# Patient Record
Sex: Female | Born: 1980 | Race: Black or African American | Hispanic: No | Marital: Married | Smoking: Never smoker
Health system: Southern US, Community
[De-identification: ages and names within clinical notes are randomized; demographics above are authoritative.]

## PROBLEM LIST (undated history)

## (undated) DIAGNOSIS — S335XXA Sprain of ligaments of lumbar spine, initial encounter: Secondary | ICD-10-CM

## (undated) DIAGNOSIS — Z9049 Acquired absence of other specified parts of digestive tract: Secondary | ICD-10-CM

## (undated) DIAGNOSIS — F4310 Post-traumatic stress disorder, unspecified: Secondary | ICD-10-CM

## (undated) DIAGNOSIS — L02419 Cutaneous abscess of limb, unspecified: Secondary | ICD-10-CM

## (undated) DIAGNOSIS — T7840XA Allergy, unspecified, initial encounter: Secondary | ICD-10-CM

## (undated) DIAGNOSIS — M5430 Sciatica, unspecified side: Secondary | ICD-10-CM

## (undated) HISTORY — DX: Post-traumatic stress disorder, unspecified: F43.10

## (undated) HISTORY — DX: Allergy, unspecified, initial encounter: T78.40XA

## (undated) HISTORY — PX: APPENDECTOMY: SHX54

---
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
## 2007-12-08 ENCOUNTER — Emergency Department (HOSPITAL_COMMUNITY): Admission: EM | Admit: 2007-12-08 | Discharge: 2007-12-08 | Payer: Self-pay | Admitting: Emergency Medicine

## 2008-12-30 ENCOUNTER — Ambulatory Visit: Payer: Self-pay | Admitting: Diagnostic Radiology

## 2008-12-30 ENCOUNTER — Emergency Department (HOSPITAL_BASED_OUTPATIENT_CLINIC_OR_DEPARTMENT_OTHER): Admission: EM | Admit: 2008-12-30 | Discharge: 2008-12-30 | Payer: Self-pay | Admitting: Emergency Medicine

## 2010-12-31 LAB — DIFFERENTIAL
Basophils Absolute: 0
Basophils Relative: 0
Lymphocytes Relative: 19

## 2010-12-31 LAB — URINALYSIS, ROUTINE W REFLEX MICROSCOPIC
Hgb urine dipstick: NEGATIVE
Ketones, ur: NEGATIVE
Protein, ur: NEGATIVE
Specific Gravity, Urine: 1.012

## 2010-12-31 LAB — CBC
HCT: 37.8
MCHC: 33.6
RBC: 4.3
RDW: 12.4
WBC: 5.7

## 2010-12-31 LAB — POCT CARDIAC MARKERS
CKMB, poc: <1 — ABNORMAL LOW
Myoglobin, poc: 31.9

## 2010-12-31 LAB — COMPREHENSIVE METABOLIC PANEL
ALT: 8
AST: 19
Albumin: 2.8 — ABNORMAL LOW
Alkaline Phosphatase: 42
BUN: 4 — ABNORMAL LOW
CO2: 25
Calcium: 8.6
Creatinine, Ser: 0.69
GFR calc non Af Amer: >60
Potassium: 4
Total Bilirubin: 1.2
Total Protein: 6.3

## 2012-05-03 ENCOUNTER — Encounter (HOSPITAL_COMMUNITY): Payer: Self-pay | Admitting: Emergency Medicine

## 2012-05-03 ENCOUNTER — Emergency Department (HOSPITAL_COMMUNITY)
Admission: EM | Admit: 2012-05-03 | Discharge: 2012-05-03 | Disposition: A | Discharge status: Home / Self Care | Payer: Medicaid Other | Attending: Emergency Medicine | Admitting: Emergency Medicine

## 2012-05-03 DIAGNOSIS — R5381 Other malaise: Secondary | ICD-10-CM | POA: Insufficient documentation

## 2012-05-03 DIAGNOSIS — J1189 Influenza due to unidentified influenza virus with other manifestations: Secondary | ICD-10-CM | POA: Insufficient documentation

## 2012-05-03 DIAGNOSIS — R6889 Other general symptoms and signs: Secondary | ICD-10-CM

## 2012-05-03 MED ORDER — HYDROCODONE-ACETAMINOPHEN 7.5-325 MG/15ML PO SOLN: 15.0000 mL | Freq: Four times a day (QID) | ORAL | Status: DC | PRN

## 2012-05-03 NOTE — ED Provider Notes (Signed)
History     CSN: [id number]  Arrival date & time 05/03/12  1020   First MD Initiated Contact with Patient 05/03/12 1041      Chief Complaint  Patient presents with  . Cough  . Generalized Body Aches    (Consider location/radiation/quality/duration/timing/severity/associated sxs/prior treatment) Patient is a 32 y.o. female presenting with URI. The history is provided by the patient. No language interpreter was used.  URI The primary symptoms include fever, fatigue, headaches, cough and myalgias. Primary symptoms do not include ear pain, sore throat, swollen glands, wheezing, abdominal pain, nausea, vomiting, arthralgias or rash. The current episode started yesterday. This is a new problem. The problem has been gradually worsening.  The fever began yesterday. The maximum temperature recorded prior to her arrival was 101 to 101.9 F. The temperature was taken by an oral thermometer.  The headache began yesterday. The headache developed gradually. Headache is a new problem. The pain from the headache is at a severity of 3/10. The headache is not associated with aura, photophobia, double vision, stiff neck or loss of balance.    History reviewed. No pertinent past medical history.  History reviewed. No pertinent past surgical history.  No family history on file.  History  Substance Use Topics  . Smoking status: Never Smoker   . Smokeless tobacco: Not on file  . Alcohol Use: No    OB History    Grav Para Term Preterm Abortions TAB SAB Ect Mult Living                  Review of Systems  Constitutional: Positive for fever and fatigue.       10 Systems reviewed and all are negative for acute change except as noted in the HPI.   HENT: Negative for ear pain and sore throat.   Eyes: Negative for double vision and photophobia.  Respiratory: Positive for cough. Negative for wheezing.   Gastrointestinal: Negative for nausea, vomiting and abdominal pain.  Musculoskeletal: Positive for  myalgias. Negative for arthralgias.  Skin: Negative for rash.  Neurological: Positive for headaches. Negative for loss of balance.    Allergies  Aspirin; Motrin; and Latex  Home Medications  No current outpatient prescriptions on file.  BP 140/86  Pulse 92  Temp 98.7 F (37.1 C) (Oral)  Resp 18  SpO2 100%  Physical Exam  Nursing note and vitals reviewed. Constitutional: She is oriented to person, place, and time. She appears well-developed and well-nourished. No distress.       Awake, alert, nontoxic appearance  HENT:  Head: Atraumatic.  Right Ear: External ear normal.  Left Ear: External ear normal.  Nose: Nose normal.  Mouth/Throat: Oropharynx is clear and moist. No oropharyngeal exudate.  Eyes: Conjunctivae normal are normal. Right eye exhibits no discharge. Left eye exhibits no discharge.  Neck: Neck supple.       No nuchal rigidity  Cardiovascular: Normal rate and regular rhythm.   Pulmonary/Chest: Effort normal. No respiratory distress. She has no wheezes. She has no rales. She exhibits no tenderness.  Abdominal: Soft. There is no tenderness. There is no rebound.  Musculoskeletal: She exhibits no tenderness.       ROM appears intact, no obvious focal weakness  Neurological: She is alert and oriented to person, place, and time. She has normal strength. No cranial nerve deficit. GCS eye subscore is 4. GCS verbal subscore is 5. GCS motor subscore is 6.       Mental status and motor strength appears  intact  Skin: No rash noted.  Psychiatric: She has a normal mood and affect.    ED Course  Procedures (including critical care time)  Labs Reviewed - No data to display No results found.   No diagnosis found.  11:16 AM Patient was seen and evaluated by me for the symptoms. Symptoms suggestive of influenza. She is afebrile her vital signs stable, appearing nontoxic. No evidence suggestive of meningitis, sinusitis, peritonsillar abscess, or pneumonia. Although patient  may benefit from Tamiflu, I discuss risk and benefit of the medication and patient opted not to take it. Will DC with cough medication and care instructions. Return precautions given. Patient voiced understanding and agrees with plan.  BP 140/86  Pulse 92  Temp 98.7 F (37.1 C) (Oral)  Resp 18  SpO2 100%  I have reviewed nursing notes and vital signs.  I reviewed available ER/hospitalization records thought the EMR   1. Flu-like symptoms   MDM          Fayrene Helper, PA-C 05/03/12 1118  Fayrene Helper, PA-C 05/03/12 1223

## 2012-05-03 NOTE — ED Notes (Signed)
Onset one day ago genaral body achy, non productive cough, fever and headache 7/10 throbbing.  Symptoms continued today.

## 2012-05-05 NOTE — ED Provider Notes (Signed)
Medical screening examination/treatment/procedure(s) were performed by non-physician practitioner and as supervising physician I was immediately available for consultation/collaboration.   Kaiesha Tonner, MD 05/05/12 0700 

## 2013-01-31 ENCOUNTER — Emergency Department (HOSPITAL_COMMUNITY)
Admission: EM | Admit: 2013-01-31 | Discharge: 2013-02-01 | Disposition: A | Discharge status: Home / Self Care | Payer: Medicaid Other | Attending: Emergency Medicine | Admitting: Emergency Medicine

## 2013-01-31 ENCOUNTER — Encounter (HOSPITAL_COMMUNITY): Payer: Self-pay | Admitting: Emergency Medicine

## 2013-01-31 ENCOUNTER — Emergency Department (HOSPITAL_COMMUNITY): Payer: Medicaid Other

## 2013-01-31 DIAGNOSIS — X500XXA Overexertion from strenuous movement or load, initial encounter: Secondary | ICD-10-CM | POA: Insufficient documentation

## 2013-01-31 DIAGNOSIS — IMO0002 Reserved for concepts with insufficient information to code with codable children: Secondary | ICD-10-CM | POA: Insufficient documentation

## 2013-01-31 DIAGNOSIS — Z880 Allergy status to penicillin: Secondary | ICD-10-CM | POA: Insufficient documentation

## 2013-01-31 DIAGNOSIS — Y9389 Activity, other specified: Secondary | ICD-10-CM | POA: Insufficient documentation

## 2013-01-31 DIAGNOSIS — M549 Dorsalgia, unspecified: Secondary | ICD-10-CM

## 2013-01-31 DIAGNOSIS — Y929 Unspecified place or not applicable: Secondary | ICD-10-CM | POA: Insufficient documentation

## 2013-01-31 DIAGNOSIS — Z91040 Latex allergy status: Secondary | ICD-10-CM | POA: Insufficient documentation

## 2013-01-31 LAB — CBC WITH DIFFERENTIAL/PLATELET
Basophils Relative: 0 % (ref 0–1)
Eosinophils Relative: 3 % (ref 0–5)
HCT: 32.9 % — ABNORMAL LOW (ref 36.0–46.0)
Lymphocytes Relative: 59 % — ABNORMAL HIGH (ref 12–46)
Lymphs Abs: 3.9 10*3/uL (ref 0.7–4.0)
MCH: 24.4 pg — ABNORMAL LOW (ref 26.0–34.0)
Monocytes Absolute: 0.5 10*3/uL (ref 0.1–1.0)
Monocytes Relative: 7 % (ref 3–12)
Neutrophils Relative %: 31 % — ABNORMAL LOW (ref 43–77)

## 2013-01-31 LAB — POCT I-STAT, CHEM 8
BUN: 8 mg/dL (ref 6–23)
Creatinine, Ser: 1.1 mg/dL (ref 0.50–1.10)
HCT: 35 % — ABNORMAL LOW (ref 36.0–46.0)
TCO2: 27 mmol/L (ref 0–100)

## 2013-01-31 LAB — D-DIMER, QUANTITATIVE: D-Dimer, Quant: <0.27 ug/mL-FEU (ref 0.00–0.48)

## 2013-01-31 MED ORDER — PREDNISONE 20 MG PO TABS
60.0000 mg | ORAL_TABLET | Freq: Once | ORAL | Status: AC
  Administered 2013-01-31: 60 mg via ORAL
  Filled 2013-01-31: qty 3

## 2013-01-31 MED ORDER — DIAZEPAM 5 MG PO TABS
5.0000 mg | ORAL_TABLET | Freq: Once | ORAL | Status: AC
  Administered 2013-01-31: 5 mg via ORAL
  Filled 2013-01-31: qty 1

## 2013-01-31 MED ORDER — OXYCODONE-ACETAMINOPHEN 5-325 MG PO TABS
2.0000 | ORAL_TABLET | Freq: Once | ORAL | Status: AC
  Administered 2013-01-31: 2 via ORAL
  Filled 2013-01-31: qty 2

## 2013-01-31 MED ORDER — DIAZEPAM 5 MG/ML IJ SOLN
5.0000 mg | Freq: Once | INTRAMUSCULAR | Status: AC
  Administered 2013-01-31: 5 mg via INTRAMUSCULAR
  Filled 2013-01-31: qty 2

## 2013-01-31 MED ORDER — HYDROMORPHONE HCL PF 1 MG/ML IJ SOLN
1.0000 mg | Freq: Once | INTRAMUSCULAR | Status: AC
  Administered 2013-01-31: 1 mg via INTRAMUSCULAR
  Filled 2013-01-31: qty 1

## 2013-01-31 NOTE — ED Provider Notes (Signed)
CSN: [id number]     Arrival date & time 01/31/13  1854 History  This chart was scribed for non-physician practitioner Jaynie Crumble, PA, working with Derwood Kaplan, MD by Ronal Fear, ED scribe. This patient was seen in room TR11C/TR11C and the patient's care was started at 8:54 PM.    Chief Complaint  Patient presents with  . Back Pain   Patient is a 32 y.o. female presenting with back pain. The history is provided by the patient. No language interpreter was used.  Back Pain Location:  Thoracic spine Radiates to:  L shoulder Pain severity:  Mild Pain is:  Same all the time Onset quality:  Sudden Duration:  12 hours Timing:  Constant Progression:  Worsening Chronicity:  Recurrent Context: twisting   Relieved by:  Nothing Worsened by:  Ambulation, movement, deep breathing and twisting Ineffective treatments:  OTC medications Associated symptoms: no bladder incontinence, no bowel incontinence, no leg pain and no numbness   HPI Comments: Joanne Hanna is a 32 y.o. female who presents to the Emergency Department complaining of sudden onset generalized back pain that is worse with deep breaths and movement with associated tingling in her left arm. Pt states that she turned abruptly and hurt her back.  She has had prior occurences that were shorter in duration.  Pt denies abdominal pain or stomach pain.  History reviewed. No pertinent past medical history. Past Surgical History  Procedure Laterality Date  . Appendectomy     No family history on file. History  Substance Use Topics  . Smoking status: Never Smoker   . Smokeless tobacco: Not on file  . Alcohol Use: No   OB History   Grav Para Term Preterm Abortions TAB SAB Ect Mult Living                 Review of Systems  Gastrointestinal: Negative for bowel incontinence.  Genitourinary: Negative for bladder incontinence.  Musculoskeletal: Positive for back pain and myalgias.  Neurological: Negative for numbness.  All other  systems reviewed and are negative.    Allergies  Aspirin; Food; Motrin; Latex; and Penicillins  Home Medications   Current Outpatient Rx  Name  Route  Sig  Dispense  Refill  . acetaminophen (TYLENOL) 500 MG tablet   Oral   Take 1,000 mg by mouth every 6 (six) hours as needed for moderate pain.          BP 154/109  Pulse 105  Temp(Src) 98 F (36.7 C) (Oral)  Resp 18  Ht 5\' 3"  (1.6 m)  Wt 230 lb (104.327 kg)  BMI 40.75 kg/m2  SpO2 97%  LMP 01/24/2013 Physical Exam  Nursing note and vitals reviewed. Constitutional: She appears well-developed and well-nourished. No distress.  HENT:  Head: Normocephalic.  Neck: Neck supple.  Cardiovascular: Normal rate, regular rhythm and normal heart sounds.   Pulmonary/Chest: Effort normal and breath sounds normal. No respiratory distress. She has no wheezes.  Abdominal: Soft. Bowel sounds are normal. She exhibits no distension. There is no tenderness. There is no rebound.  No CVA tenderness  Musculoskeletal: She exhibits tenderness.  Tenderness to thoracic paravertebral region. No midline tenderness. No swelling or bruising  Neurological:  5/5 and equal lower extremity strength. 2+ and equal patellar reflexes bilaterally. Pt able to dorsiflex bilateral toes and feet with good strength against resistance. Equal sensation bilaterally over thighs and lower legs.      ED Course  Procedures (including critical care time) DIAGNOSTIC STUDIES: Oxygen Saturation is 97%  on RA, adequate by my interpretation.    COORDINATION OF CARE:    9:01 PM- Pt advised of plan for treatment including medication for pain and pt agrees.  Labs Review Labs Reviewed  CBC WITH DIFFERENTIAL - Abnormal; Notable for the following:    Hemoglobin 10.5 (*)    HCT 32.9 (*)    MCV 76.3 (*)    MCH 24.4 (*)    RDW 16.3 (*)    Neutrophils Relative % 31 (*)    Lymphocytes Relative 59 (*)    All other components within normal limits  POCT I-STAT, CHEM 8 -  Abnormal; Notable for the following:    Calcium, Ion 1.32 (*)    Hemoglobin 11.9 (*)    HCT 35.0 (*)    All other components within normal limits  D-DIMER, QUANTITATIVE   Imaging Review Dg Chest 2 View  01/31/2013   CLINICAL DATA:  Chest pain.  EXAM: CHEST  2 VIEW  COMPARISON:  CHEST x-ray 12/08/2007.  FINDINGS: Lung volumes are normal. No consolidative airspace disease. No pleural effusions. No pneumothorax. No pulmonary nodule or mass noted. Pulmonary vasculature and the cardiomediastinal silhouette are within normal limits.  IMPRESSION: 1.  No radiographic evidence of acute cardiopulmonary disease.   Electronically Signed   By: Trudie Reed M.D.   On: 01/31/2013 22:30    EKG Interpretation   None       MDM   1. Back pain     Shunt with thoracic back pain, reproducible on palpation and movement. Her pain is intractable due to multiple doses of different medications. Patient received 1 mg of Dilaudid IM, 2 Percocets by mouth, 5 mg of Valium by mouth, 5 additional milligrams of Valium IM, all with no relief of her pain. Patient states that her pain actually increased from 6 to attend now. She denies any chest pain. I did get a chest x-ray due to pleuritic component of her pain and a d-dimer which both are negative. She's afebrile emergency department. Her vital signs are normal. Every time in the room patient is laughing however she refuses to move because she states she's in a lot of pain. I discussed this with Dr. Ranae Palms to see if any additional tests would be helpful. I this time I do not think any imaging is indicated and I do think her pain is musculoskeletal and could be do to either a pinched nerve or a muscle spasm. I will start her home on Percocet and Valium for spasms. She is to followup with her doctor.   Filed Vitals:   01/31/13 1859 01/31/13 2359  BP: 154/109 121/85  Pulse: 105 61  Temp: 98 F (36.7 C)   TempSrc: Oral   Resp: 18 19  Height: 5\' 3"  (1.6 m)    Weight: 230 lb (104.327 kg)   SpO2: 97% 99%   I personally performed the services described in this documentation, which was scribed in my presence. The recorded information has been reviewed and is accurate.   Lottie Mussel, PA-C 02/01/13 [phone]

## 2013-01-31 NOTE — ED Notes (Signed)
Pt had sudden onset of mid back pain after making a twisting movement.  Pt states that she took tylenol and tried to rest but pain has been increasing (this began this am).  Pt cant stand up straight due to pain.  This has occurred before but never this severe.

## 2013-01-31 NOTE — ED Notes (Signed)
PT changed positions and is now flat on the stretcher . Pt also reports pain is unchanged.

## 2013-02-01 MED ORDER — OXYCODONE-ACETAMINOPHEN 5-325 MG PO TABS: 1.0000 | ORAL_TABLET | ORAL | Status: DC | PRN

## 2013-02-01 MED ORDER — DIAZEPAM 5 MG PO TABS: 5.0000 mg | ORAL_TABLET | Freq: Three times a day (TID) | ORAL | Status: DC | PRN

## 2013-02-03 ENCOUNTER — Encounter (HOSPITAL_COMMUNITY): Payer: Self-pay | Admitting: Emergency Medicine

## 2013-02-03 ENCOUNTER — Emergency Department (HOSPITAL_COMMUNITY): Payer: Medicaid Other

## 2013-02-03 ENCOUNTER — Emergency Department (HOSPITAL_COMMUNITY)
Admission: EM | Admit: 2013-02-03 | Discharge: 2013-02-03 | Disposition: A | Discharge status: Home / Self Care | Payer: Medicaid Other | Attending: Emergency Medicine | Admitting: Emergency Medicine

## 2013-02-03 DIAGNOSIS — Z91040 Latex allergy status: Secondary | ICD-10-CM | POA: Insufficient documentation

## 2013-02-03 DIAGNOSIS — Z880 Allergy status to penicillin: Secondary | ICD-10-CM | POA: Insufficient documentation

## 2013-02-03 DIAGNOSIS — Z872 Personal history of diseases of the skin and subcutaneous tissue: Secondary | ICD-10-CM | POA: Insufficient documentation

## 2013-02-03 DIAGNOSIS — M546 Pain in thoracic spine: Secondary | ICD-10-CM

## 2013-02-03 DIAGNOSIS — Z87828 Personal history of other (healed) physical injury and trauma: Secondary | ICD-10-CM | POA: Insufficient documentation

## 2013-02-03 HISTORY — DX: Acquired absence of other specified parts of digestive tract: Z90.49

## 2013-02-03 HISTORY — DX: Sciatica, unspecified side: M54.30

## 2013-02-03 HISTORY — DX: Cutaneous abscess of limb, unspecified: L02.419

## 2013-02-03 HISTORY — DX: Sprain of ligaments of lumbar spine, initial encounter: S33.5XXA

## 2013-02-03 MED ORDER — CYCLOBENZAPRINE HCL 10 MG PO TABS: 10.0000 mg | ORAL_TABLET | Freq: Two times a day (BID) | ORAL | Status: DC | PRN

## 2013-02-03 MED ORDER — HYDROMORPHONE HCL PF 1 MG/ML IJ SOLN
1.0000 mg | Freq: Once | INTRAMUSCULAR | Status: AC
  Administered 2013-02-03: 1 mg via INTRAMUSCULAR
  Filled 2013-02-03: qty 1

## 2013-02-03 MED ORDER — PREDNISONE 20 MG PO TABS: ORAL_TABLET | ORAL | Status: DC

## 2013-02-03 MED ORDER — OXYCODONE-ACETAMINOPHEN 5-325 MG PO TABS: 2.0000 | ORAL_TABLET | ORAL | Status: DC | PRN

## 2013-02-03 NOTE — ED Provider Notes (Signed)
CSN: [id number]     Arrival date & time 02/03/13  1327 History   First MD Initiated Contact with Patient 02/03/13 1613     Chief Complaint  Patient presents with  . Pleurisy  . Back Pain   (Consider location/radiation/quality/duration/timing/severity/associated sxs/prior Treatment) HPI.... pain in the left upper back approximately T10 level for several days.   Patient was seen on 01/31/2013 in the ED for similar complaints.  She is uncertain of the mechanism of the pain, but no fever, sweats, chest, cough neurological deficits.  Palpation makes pain worse.  Past Medical History  Diagnosis Date  . History of appendectomy 1999  . Abscess of axilla   . Lumbar back sprain   . Sciatica    Past Surgical History  Procedure Laterality Date  . Appendectomy     No family history on file. History  Substance Use Topics  . Smoking status: Never Smoker   . Smokeless tobacco: Not on file  . Alcohol Use: No   OB History   Grav Para Term Preterm Abortions TAB SAB Ect Mult Living                 Review of Systems  All other systems reviewed and are negative.    Allergies  Aspirin; Food; Motrin; Latex; and Penicillins  Home Medications   Current Outpatient Rx  Name  Route  Sig  Dispense  Refill  . acetaminophen (TYLENOL) 500 MG tablet   Oral   Take 1,000 mg by mouth every 6 (six) hours as needed for moderate pain.         . diazepam (VALIUM) 5 MG tablet   Oral   Take 1 tablet (5 mg total) by mouth every 8 (eight) hours as needed for anxiety.   15 tablet   0   . oxyCODONE-acetaminophen (PERCOCET) 5-325 MG per tablet   Oral   Take 1 tablet by mouth every 4 (four) hours as needed for severe pain.   20 tablet   0   . cyclobenzaprine (FLEXERIL) 10 MG tablet   Oral   Take 1 tablet (10 mg total) by mouth 2 (two) times daily as needed for muscle spasms.   20 tablet   0   . oxyCODONE-acetaminophen (PERCOCET) 5-325 MG per tablet   Oral   Take 2 tablets by mouth every 4  (four) hours as needed.   20 tablet   0   . predniSONE (DELTASONE) 20 MG tablet      3 tabs po day one, then 2 tabs daily x 4 days   11 tablet   0    BP 136/79  Pulse 74  Temp(Src) 97.4 F (36.3 C) (Oral)  Resp 18  Ht 5\' 3"  (1.6 m)  Wt 230 lb (104.327 kg)  BMI 40.75 kg/m2  SpO2 100%  LMP 01/16/2013 Physical Exam  Nursing note and vitals reviewed. Constitutional: She is oriented to person, place, and time. She appears well-developed and well-nourished.  HENT:  Head: Normocephalic and atraumatic.  Eyes: Conjunctivae and EOM are normal. Pupils are equal, round, and reactive to light.  Neck: Normal range of motion. Neck supple.  Cardiovascular: Normal rate, regular rhythm and normal heart sounds.   Pulmonary/Chest: Effort normal and breath sounds normal.  Abdominal: Soft. Bowel sounds are normal.  Musculoskeletal: Normal range of motion.  Tender left upper back approx T10 area  Neurological: She is alert and oriented to person, place, and time.  Skin: Skin is warm and dry.  Psychiatric:  She has a normal mood and affect.    ED Course  Procedures (including critical care time) Labs Review Labs Reviewed - No data to display Imaging Review Dg Chest 2 View  02/03/2013   CLINICAL DATA:  Chest pain  EXAM: CHEST  2 VIEW  COMPARISON:  01/31/2013  FINDINGS: The heart size and mediastinal contours are within normal limits. Both lungs are clear. The visualized skeletal structures are unremarkable.  IMPRESSION: No active cardiopulmonary disease.   Electronically Signed   By: Alcide Clever M.D.   On: 02/03/2013 15:13    EKG Interpretation   None       MDM   1. Thoracic back pain    Patient has normal vital signs. She is tender to palpation in her musculature.  Discharge medications oxycodone, Flexeril 10 mg, prednisone    Donnetta Hutching, MD 02/03/13 [phone]

## 2013-02-03 NOTE — ED Notes (Addendum)
Pt reports she has been taking valium and pain pills and using a heating pad for back pain. Hurts with movement and reports she cannot work due to pain. Hurts all across ribs, worse with laughing, cough, deep breathe, and standing straight up. Feels like pulling and stabbing. Pt seems to be in distress due to pain.

## 2013-02-03 NOTE — ED Notes (Signed)
Pt took prescription percocet at 1000 today, no relief

## 2013-02-03 NOTE — Discharge Instructions (Signed)
Back Pain, Adult Low back pain is very common. About 1 in 5 people have back pain.The cause of low back pain is rarely dangerous. The pain often gets better over time.About half of people with a sudden onset of back pain feel better in just 2 weeks. About 8 in 10 people feel better by 6 weeks.  CAUSES Some common causes of back pain include:  Strain of the muscles or ligaments supporting the spine.  Wear and tear (degeneration) of the spinal discs.  Arthritis.  Direct injury to the back. DIAGNOSIS Most of the time, the direct cause of low back pain is not known.However, back pain can be treated effectively even when the exact cause of the pain is unknown.Answering your caregiver's questions about your overall health and symptoms is one of the most accurate ways to make sure the cause of your pain is not dangerous. If your caregiver needs more information, he or she may order lab work or imaging tests (X-rays or MRIs).However, even if imaging tests show changes in your back, this usually does not require surgery. HOME CARE INSTRUCTIONS For many people, back pain returns.Since low back pain is rarely dangerous, it is often a condition that people can learn to [facility] their own.   Remain active. It is stressful on the back to sit or stand in one place. Do not sit, drive, or stand in one place for more than 30 minutes at a time. Take short walks on level surfaces as soon as pain allows.Try to increase the length of time you walk each day.  Do not stay in bed.Resting more than 1 or 2 days can delay your recovery.  Do not avoid exercise or work.Your body is made to move.It is not dangerous to be active, even though your back may hurt.Your back will likely heal faster if you return to being active before your pain is gone.  Pay attention to your body when you bend and lift. Many people have less discomfortwhen lifting if they bend their knees, keep the load close to their bodies,and  avoid twisting. Often, the most comfortable positions are those that put less stress on your recovering back.  Find a comfortable position to sleep. Use a firm mattress and lie on your side with your knees slightly bent. If you lie on your back, put a pillow under your knees.  Only take over-the-counter or prescription medicines as directed by your caregiver. Over-the-counter medicines to reduce pain and inflammation are often the most helpful.Your caregiver may prescribe muscle relaxant drugs.These medicines help dull your pain so you can more quickly return to your normal activities and healthy exercise.  Put ice on the injured area.  Put ice in a plastic bag.  Place a towel between your skin and the bag.  Leave the ice on for 15-20 minutes, 03-04 times a day for the first 2 to 3 days. After that, ice and heat may be alternated to reduce pain and spasms.  Ask your caregiver about trying back exercises and gentle massage. This may be of some benefit.  Avoid feeling anxious or stressed.Stress increases muscle tension and can worsen back pain.It is important to recognize when you are anxious or stressed and learn ways to manage it.Exercise is a great option. SEEK MEDICAL CARE IF:  You have pain that is not relieved with rest or medicine.  You have pain that does not improve in 1 week.  You have new symptoms.  You are generally not feeling well. SEEK  IMMEDIATE MEDICAL CARE IF:   You have pain that radiates from your back into your legs.  You develop new bowel or bladder control problems.  You have unusual weakness or numbness in your arms or legs.  You develop nausea or vomiting.  You develop abdominal pain.  You feel faint. Document Released: 03/16/2005 Document Revised: 09/15/2011 Document Reviewed: 08/04/2010 [facility] Patient Information 2014 [place], [place].   Medication for pain, muscle spasm, prednisone.   Alternate heat and ice.

## 2013-02-04 NOTE — ED Provider Notes (Signed)
Medical screening examination/treatment/procedure(s) were performed by non-physician practitioner and as supervising physician I was immediately available for consultation/collaboration.  EKG Interpretation   None        Junius Faucett, MD 02/04/13 1628 

## 2013-02-14 ENCOUNTER — Encounter (HOSPITAL_COMMUNITY): Payer: Self-pay | Admitting: Radiology

## 2013-02-14 ENCOUNTER — Emergency Department (HOSPITAL_COMMUNITY)
Admission: EM | Admit: 2013-02-14 | Discharge: 2013-02-14 | Disposition: A | Discharge status: Home / Self Care | Payer: Medicaid Other | Attending: Emergency Medicine | Admitting: Emergency Medicine

## 2013-02-14 ENCOUNTER — Emergency Department (HOSPITAL_COMMUNITY): Payer: Medicaid Other

## 2013-02-14 DIAGNOSIS — Z3202 Encounter for pregnancy test, result negative: Secondary | ICD-10-CM | POA: Insufficient documentation

## 2013-02-14 DIAGNOSIS — Z91040 Latex allergy status: Secondary | ICD-10-CM | POA: Insufficient documentation

## 2013-02-14 DIAGNOSIS — Z8739 Personal history of other diseases of the musculoskeletal system and connective tissue: Secondary | ICD-10-CM | POA: Insufficient documentation

## 2013-02-14 DIAGNOSIS — Z880 Allergy status to penicillin: Secondary | ICD-10-CM | POA: Insufficient documentation

## 2013-02-14 DIAGNOSIS — R109 Unspecified abdominal pain: Secondary | ICD-10-CM | POA: Insufficient documentation

## 2013-02-14 DIAGNOSIS — R1110 Vomiting, unspecified: Secondary | ICD-10-CM | POA: Insufficient documentation

## 2013-02-14 DIAGNOSIS — Z9889 Other specified postprocedural states: Secondary | ICD-10-CM | POA: Insufficient documentation

## 2013-02-14 DIAGNOSIS — Z888 Allergy status to other drugs, medicaments and biological substances status: Secondary | ICD-10-CM | POA: Insufficient documentation

## 2013-02-14 LAB — CBC WITH DIFFERENTIAL/PLATELET
Basophils Relative: 0 % (ref 0–1)
Eosinophils Absolute: 0.1 10*3/uL (ref 0.0–0.7)
Eosinophils Relative: 1 % (ref 0–5)
HCT: 36 % (ref 36.0–46.0)
Hemoglobin: 11.7 g/dL — ABNORMAL LOW (ref 12.0–15.0)
Lymphocytes Relative: 32 % (ref 12–46)
MCHC: 32.5 g/dL (ref 30.0–36.0)
MCV: 74.4 fL — ABNORMAL LOW (ref 78.0–100.0)
Monocytes Absolute: 0.6 10*3/uL (ref 0.1–1.0)
Monocytes Relative: 6 % (ref 3–12)
Neutro Abs: 5.4 10*3/uL (ref 1.7–7.7)
WBC: 8.9 10*3/uL (ref 4.0–10.5)

## 2013-02-14 LAB — URINALYSIS, ROUTINE W REFLEX MICROSCOPIC
Glucose, UA: NEGATIVE mg/dL
Ketones, ur: NEGATIVE mg/dL
Leukocytes, UA: NEGATIVE
Nitrite: NEGATIVE
Protein, ur: NEGATIVE mg/dL
Specific Gravity, Urine: 1.002 — ABNORMAL LOW (ref 1.005–1.030)
Urobilinogen, UA: 0.2 mg/dL (ref 0.0–1.0)

## 2013-02-14 LAB — COMPREHENSIVE METABOLIC PANEL
ALT: 17 U/L (ref 0–35)
BUN: 9 mg/dL (ref 6–23)
CO2: 23 mEq/L (ref 19–32)
Calcium: 9.7 mg/dL (ref 8.4–10.5)
Chloride: 100 mEq/L (ref 96–112)
Creatinine, Ser: 0.88 mg/dL (ref 0.50–1.10)
GFR calc Af Amer: >90 mL/min (ref >90)
GFR calc non Af Amer: 86 mL/min — ABNORMAL LOW (ref >90)
Glucose, Bld: 96 mg/dL (ref 70–99)
Total Bilirubin: 0.5 mg/dL (ref 0.3–1.2)
Total Protein: 7.9 g/dL (ref 6.0–8.3)

## 2013-02-14 LAB — LIPASE, BLOOD: Lipase: 25 U/L (ref 11–59)

## 2013-02-14 MED ORDER — IOHEXOL 300 MG/ML  SOLN
80.0000 mL | Freq: Once | INTRAMUSCULAR | Status: AC | PRN
  Administered 2013-02-14: 80 mL via INTRAVENOUS

## 2013-02-14 MED ORDER — MORPHINE SULFATE 4 MG/ML IJ SOLN
4.0000 mg | Freq: Once | INTRAMUSCULAR | Status: AC
  Administered 2013-02-14: 4 mg via INTRAVENOUS
  Filled 2013-02-14: qty 1

## 2013-02-14 MED ORDER — MORPHINE SULFATE 4 MG/ML IJ SOLN
4.0000 mg | Freq: Once | INTRAMUSCULAR | Status: DC
  Filled 2013-02-14: qty 1

## 2013-02-14 MED ORDER — ONDANSETRON HCL 4 MG/2ML IJ SOLN
4.0000 mg | Freq: Once | INTRAMUSCULAR | Status: AC
  Administered 2013-02-14: 4 mg via INTRAVENOUS
  Filled 2013-02-14: qty 2

## 2013-02-14 MED ORDER — HYDROCODONE-ACETAMINOPHEN 5-325 MG PO TABS: 2.0000 | ORAL_TABLET | ORAL | Status: DC | PRN

## 2013-02-14 NOTE — ED Provider Notes (Addendum)
CSN: [id number]     Arrival date & time 02/14/13  0145 History   First MD Initiated Contact with Patient 02/14/13 0227     Chief Complaint  Patient presents with  . Abdominal Pain   (Consider location/radiation/quality/duration/timing/severity/associated sxs/prior Treatment) Patient is a 32 y.o. female presenting with abdominal pain. The history is provided by the patient.  Abdominal Pain Pain location:  Epigastric Pain quality: cramping   Pain radiates to:  Does not radiate Pain severity:  Severe Onset quality:  Sudden Duration:  24 hours Timing:  Constant Progression:  Worsening Chronicity:  New Context: not eating   Relieved by:  Nothing Worsened by:  Nothing tried Ineffective treatments:  None tried   Past Medical History  Diagnosis Date  . History of appendectomy 1999  . Abscess of axilla   . Lumbar back sprain   . Sciatica    Past Surgical History  Procedure Laterality Date  . Appendectomy     No family history on file. History  Substance Use Topics  . Smoking status: Never Smoker   . Smokeless tobacco: Not on file  . Alcohol Use: No   OB History   Grav Para Term Preterm Abortions TAB SAB Ect Mult Living                 Review of Systems  Gastrointestinal: Positive for abdominal pain.    Allergies  Aspirin; Food; Motrin; Latex; and Penicillins  Home Medications   Current Outpatient Rx  Name  Route  Sig  Dispense  Refill  . acetaminophen (TYLENOL) 500 MG tablet   Oral   Take 1,000 mg by mouth every 6 (six) hours as needed for moderate pain.         . cyclobenzaprine (FLEXERIL) 10 MG tablet   Oral   Take 1 tablet (10 mg total) by mouth 2 (two) times daily as needed for muscle spasms.   20 tablet   0   . diazepam (VALIUM) 5 MG tablet   Oral   Take 1 tablet (5 mg total) by mouth every 8 (eight) hours as needed for anxiety.   15 tablet   0   . oxyCODONE-acetaminophen (PERCOCET) 5-325 MG per tablet   Oral   Take 1 tablet by mouth every 4  (four) hours as needed for severe pain.   20 tablet   0   . oxyCODONE-acetaminophen (PERCOCET) 5-325 MG per tablet   Oral   Take 2 tablets by mouth every 4 (four) hours as needed.   20 tablet   0   . predniSONE (DELTASONE) 20 MG tablet      3 tabs po day one, then 2 tabs daily x 4 days   11 tablet   0    BP 138/86  Pulse 93  Temp(Src) 97.8 F (36.6 C) (Oral)  Resp 20  Ht 5\' 3"  (1.6 m)  Wt 230 lb (104.327 kg)  BMI 40.75 kg/m2  SpO2 100%  LMP 01/09/2013 Physical Exam  Nursing note and vitals reviewed. Constitutional: She is oriented to person, place, and time. She appears well-developed and well-nourished. No distress.  HENT:  Head: Normocephalic and atraumatic.  Neck: Normal range of motion. Neck supple.  Cardiovascular: Normal rate and regular rhythm.  Exam reveals no gallop and no friction rub.   No murmur heard. Pulmonary/Chest: Effort normal and breath sounds normal. No respiratory distress. She has no wheezes.  Abdominal: Soft. Bowel sounds are normal. She exhibits no distension. There is tenderness.  There  is tenderness to palpation in the right upper quadrant without rebound or guarding. There are no masses.  Musculoskeletal: Normal range of motion.  Neurological: She is alert and oriented to person, place, and time.  Skin: Skin is warm and dry. She is not diaphoretic.    ED Course  Procedures (including critical care time) Labs Review Labs Reviewed  CBC WITH DIFFERENTIAL  COMPREHENSIVE METABOLIC PANEL  LIPASE, BLOOD  URINALYSIS, ROUTINE W REFLEX MICROSCOPIC  PREGNANCY, URINE   Imaging Review No results found.    MDM  No diagnosis found. Patient presents with complaints of severe mid abdominal pain that started this evening. She retched and vomited several times. Workup reveals unremarkable laboratory studies, specifically there is no leukocytosis and no evidence for hepatitis or pancreatitis. Ultrasound was performed and revealed a normal  gallbladder. She was not feeling better after the ultrasound a CT scan of the abdomen and pelvis was performed and was unremarkable. By the time the study was completed the patient was feeling much better. She was reexamined and her abdomen remained benign. At this point I do not feel as though further workup is indicated. She will be discharged with pain medication and when necessary followup if her symptoms worsen or change.    Geoffery Lyons, MD 02/14/13 4098  Geoffery Lyons, MD 02/20/13 1191  Geoffery Lyons, MD 02/21/13 [phone]

## 2013-02-14 NOTE — ED Notes (Signed)
Patient returned from CT

## 2013-02-14 NOTE — ED Notes (Signed)
Pt reports that pain started appros 1800 on 02/13/13 in upper abdomen. Pain got worse until went to bed when it was a 5/10 pain woke patient approx 0100 and was a 9-10/10 upper abdomen shooting down to lower abdomen. LMP 13 Nov 14. Denies andy discharge.

## 2013-07-19 ENCOUNTER — Encounter (HOSPITAL_COMMUNITY): Payer: Self-pay | Admitting: Emergency Medicine

## 2013-07-19 ENCOUNTER — Emergency Department (HOSPITAL_COMMUNITY)
Admission: EM | Admit: 2013-07-19 | Discharge: 2013-07-19 | Disposition: A | Discharge status: Home / Self Care | Payer: Medicaid Other | Attending: Emergency Medicine | Admitting: Emergency Medicine

## 2013-07-19 ENCOUNTER — Emergency Department (HOSPITAL_COMMUNITY)
Admission: EM | Admit: 2013-07-19 | Discharge: 2013-07-20 | Disposition: A | Discharge status: Home / Self Care | Payer: Medicaid Other | Attending: Emergency Medicine | Admitting: Emergency Medicine

## 2013-07-19 DIAGNOSIS — R112 Nausea with vomiting, unspecified: Secondary | ICD-10-CM | POA: Insufficient documentation

## 2013-07-19 DIAGNOSIS — Z880 Allergy status to penicillin: Secondary | ICD-10-CM | POA: Insufficient documentation

## 2013-07-19 DIAGNOSIS — Z87828 Personal history of other (healed) physical injury and trauma: Secondary | ICD-10-CM | POA: Insufficient documentation

## 2013-07-19 DIAGNOSIS — Z8739 Personal history of other diseases of the musculoskeletal system and connective tissue: Secondary | ICD-10-CM | POA: Insufficient documentation

## 2013-07-19 DIAGNOSIS — Z872 Personal history of diseases of the skin and subcutaneous tissue: Secondary | ICD-10-CM | POA: Insufficient documentation

## 2013-07-19 DIAGNOSIS — R197 Diarrhea, unspecified: Secondary | ICD-10-CM | POA: Insufficient documentation

## 2013-07-19 DIAGNOSIS — Z79899 Other long term (current) drug therapy: Secondary | ICD-10-CM | POA: Insufficient documentation

## 2013-07-19 DIAGNOSIS — Z91040 Latex allergy status: Secondary | ICD-10-CM | POA: Insufficient documentation

## 2013-07-19 DIAGNOSIS — Z3202 Encounter for pregnancy test, result negative: Secondary | ICD-10-CM | POA: Insufficient documentation

## 2013-07-19 DIAGNOSIS — Z9089 Acquired absence of other organs: Secondary | ICD-10-CM | POA: Insufficient documentation

## 2013-07-19 DIAGNOSIS — R079 Chest pain, unspecified: Secondary | ICD-10-CM | POA: Insufficient documentation

## 2013-07-19 LAB — CBC WITH DIFFERENTIAL/PLATELET
Basophils Absolute: 0 10*3/uL (ref 0.0–0.1)
Basophils Relative: 0 % (ref 0–1)
EOS PCT: 2 % (ref 0–5)
Eosinophils Absolute: 0.1 10*3/uL (ref 0.0–0.7)
HEMATOCRIT: 40.1 % (ref 36.0–46.0)
Hemoglobin: 12.7 g/dL (ref 12.0–15.0)
LYMPHS ABS: 1.7 10*3/uL (ref 0.7–4.0)
LYMPHS PCT: 43 % (ref 12–46)
MCH: 24.4 pg — ABNORMAL LOW (ref 26.0–34.0)
MCHC: 31.7 g/dL (ref 30.0–36.0)
MCV: 77 fL — AB (ref 78.0–100.0)
Monocytes Absolute: 0.2 10*3/uL (ref 0.1–1.0)
Monocytes Relative: 6 % (ref 3–12)
Neutro Abs: 2 10*3/uL (ref 1.7–7.7)
Neutrophils Relative %: 49 % (ref 43–77)
Platelets: ADEQUATE 10*3/uL (ref 150–400)
RBC: 5.21 MIL/uL — AB (ref 3.87–5.11)
RDW: 16.5 % — ABNORMAL HIGH (ref 11.5–15.5)
WBC: 4 10*3/uL (ref 4.0–10.5)

## 2013-07-19 LAB — BASIC METABOLIC PANEL
BUN: 9 mg/dL (ref 6–23)
CO2: 18 meq/L — AB (ref 19–32)
CREATININE: 0.77 mg/dL (ref 0.50–1.10)
Calcium: 9.4 mg/dL (ref 8.4–10.5)
Chloride: 107 mEq/L (ref 96–112)
GFR calc Af Amer: >90 mL/min (ref >90)
GFR calc non Af Amer: >90 mL/min (ref >90)
Glucose, Bld: 95 mg/dL (ref 70–99)
POTASSIUM: 4.1 meq/L (ref 3.7–5.3)
Sodium: 139 mEq/L (ref 137–147)

## 2013-07-19 LAB — URINALYSIS, ROUTINE W REFLEX MICROSCOPIC
Bilirubin Urine: NEGATIVE
GLUCOSE, UA: NEGATIVE mg/dL
Hgb urine dipstick: NEGATIVE
KETONES UR: NEGATIVE mg/dL
LEUKOCYTES UA: NEGATIVE
NITRITE: NEGATIVE
PH: 6.5 (ref 5.0–8.0)
Protein, ur: NEGATIVE mg/dL
Specific Gravity, Urine: 1.006 (ref 1.005–1.030)
Urobilinogen, UA: 0.2 mg/dL (ref 0.0–1.0)

## 2013-07-19 LAB — POC URINE PREG, ED: PREG TEST UR: NEGATIVE

## 2013-07-19 MED ORDER — SODIUM CHLORIDE 0.9 % IV SOLN
1000.0000 mL | Freq: Once | INTRAVENOUS | Status: AC
  Administered 2013-07-19: 1000 mL via INTRAVENOUS

## 2013-07-19 MED ORDER — ONDANSETRON HCL 4 MG/2ML IJ SOLN
4.0000 mg | Freq: Once | INTRAMUSCULAR | Status: AC
  Administered 2013-07-19: 4 mg via INTRAVENOUS
  Filled 2013-07-19: qty 2

## 2013-07-19 MED ORDER — SODIUM CHLORIDE 0.9 % IV SOLN: 1000.0000 mL | INTRAVENOUS | Status: DC

## 2013-07-19 MED ORDER — ONDANSETRON HCL 4 MG PO TABS: 4.0000 mg | ORAL_TABLET | Freq: Four times a day (QID) | ORAL | Status: DC | PRN

## 2013-07-19 NOTE — ED Provider Notes (Signed)
CSN: [id number]     Arrival date & time 07/19/13  [id number] History   First MD Initiated Contact with Patient 07/19/13 0541     Chief Complaint  Patient presents with  . Nausea  . Emesis  . Diarrhea     (Consider location/radiation/quality/duration/timing/severity/associated sxs/prior Treatment) Patient is a 33 y.o. female presenting with vomiting and diarrhea. The history is provided by the patient.  Emesis Associated symptoms: diarrhea   Diarrhea Associated symptoms: vomiting   She had onset yesterday morning of nausea and vomiting. Symptoms are severe and have been persistent. She has had some mild diarrhea. She denies fever, chills, sweats. There's mild abdominal soreness but no true abdominal pain. She has not taken anything to try to treat her symptoms. She denies any sick contacts and denies eating anything bad.  Past Medical History  Diagnosis Date  . History of appendectomy 1999  . Abscess of axilla   . Lumbar back sprain   . Sciatica    Past Surgical History  Procedure Laterality Date  . Appendectomy     No family history on file. History  Substance Use Topics  . Smoking status: Never Smoker   . Smokeless tobacco: Not on file  . Alcohol Use: Yes     Comment: occasionally   OB History   Grav Para Term Preterm Abortions TAB SAB Ect Mult Living                 Review of Systems  Gastrointestinal: Positive for vomiting and diarrhea.  All other systems reviewed and are negative.     Allergies  Aspirin; Food; Motrin; Latex; and Penicillins  Home Medications   Prior to Admission medications   Medication Sig Start Date End Date Taking? Authorizing Provider  acetaminophen (TYLENOL) 500 MG tablet Take 1,000 mg by mouth every 6 (six) hours as needed for moderate pain.    Historical Provider, MD  cyclobenzaprine (FLEXERIL) 10 MG tablet Take 1 tablet (10 mg total) by mouth 2 (two) times daily as needed for muscle spasms. 02/03/13   Donnetta HutchingBrian Cook, MD  diazepam (VALIUM) 5  MG tablet Take 1 tablet (5 mg total) by mouth every 8 (eight) hours as needed for anxiety. 02/01/13   Tatyana A Kirichenko, PA-C  HYDROcodone-acetaminophen (NORCO) 5-325 MG per tablet Take 2 tablets by mouth every 4 (four) hours as needed. 02/14/13   Geoffery Lyonsouglas Delo, MD  oxyCODONE-acetaminophen (PERCOCET) 5-325 MG per tablet Take 2 tablets by mouth every 4 (four) hours as needed. 02/03/13   Donnetta HutchingBrian Cook, MD   BP 101/70  Pulse 61  Temp(Src) 97.5 F (36.4 C) (Oral)  Resp 18  Ht 5\' 3"  (1.6 m)  Wt 225 lb (102.059 kg)  BMI 39.87 kg/m2  SpO2 100% Physical Exam  Nursing note and vitals reviewed.  33 year old female, who appears uncomfortable and is intermittently retching, but his in no acute distress. Vital signs are normal. Oxygen saturation is 100%, which is normal. Head is normocephalic and atraumatic. PERRLA, EOMI. Oropharynx is clear. Neck is nontender and supple without adenopathy or JVD. Back is nontender and there is no CVA tenderness. Lungs are clear without rales, wheezes, or rhonchi. Chest is nontender. Heart has regular rate and rhythm without murmur. Abdomen is soft, flat, nontender without masses or hepatosplenomegaly and peristalsis is hypoactive. Extremities have no cyanosis or edema, full range of motion is present. Skin is warm and dry without rash. Neurologic: Mental status is normal, cranial nerves are intact, there are no motor or  sensory deficits.  ED Course  Procedures (including critical care time) Labs Review Results for orders placed during the hospital encounter of 07/19/13  CBC WITH DIFFERENTIAL      Result Value Ref Range   WBC 4.0  4.0 - 10.5 K/uL   RBC 5.21 (*) 3.87 - 5.11 MIL/uL   Hemoglobin 12.7  12.0 - 15.0 g/dL   HCT 16.140.1  09.636.0 - 04.546.0 %   MCV 77.0 (*) 78.0 - 100.0 fL   MCH 24.4 (*) 26.0 - 34.0 pg   MCHC 31.7  30.0 - 36.0 g/dL   RDW 40.916.5 (*) 81.111.5 - 91.415.5 %   Platelets    150 - 400 K/uL   Value: PLATELET CLUMPS NOTED ON SMEAR, COUNT APPEARS ADEQUATE    Neutrophils Relative % 49  43 - 77 %   Lymphocytes Relative 43  12 - 46 %   Monocytes Relative 6  3 - 12 %   Eosinophils Relative 2  0 - 5 %   Basophils Relative 0  0 - 1 %   Neutro Abs 2.0  1.7 - 7.7 K/uL   Lymphs Abs 1.7  0.7 - 4.0 K/uL   Monocytes Absolute 0.2  0.1 - 1.0 K/uL   Eosinophils Absolute 0.1  0.0 - 0.7 K/uL   Basophils Absolute 0.0  0.0 - 0.1 K/uL   RBC Morphology ELLIPTOCYTES    BASIC METABOLIC PANEL      Result Value Ref Range   Sodium 139  137 - 147 mEq/L   Potassium 4.1  3.7 - 5.3 mEq/L   Chloride 107  96 - 112 mEq/L   CO2 18 (*) 19 - 32 mEq/L   Glucose, Bld 95  70 - 99 mg/dL   BUN 9  6 - 23 mg/dL   Creatinine, Ser 7.820.77  0.50 - 1.10 mg/dL   Calcium 9.4  8.4 - 95.610.5 mg/dL   GFR calc non Af Amer >90  >90 mL/min   GFR calc Af Amer >90  >90 mL/min  URINALYSIS, ROUTINE W REFLEX MICROSCOPIC      Result Value Ref Range   Color, Urine YELLOW  YELLOW   APPearance CLEAR  CLEAR   Specific Gravity, Urine 1.006  1.005 - 1.030   pH 6.5  5.0 - 8.0   Glucose, UA NEGATIVE  NEGATIVE mg/dL   Hgb urine dipstick NEGATIVE  NEGATIVE   Bilirubin Urine NEGATIVE  NEGATIVE   Ketones, ur NEGATIVE  NEGATIVE mg/dL   Protein, ur NEGATIVE  NEGATIVE mg/dL   Urobilinogen, UA 0.2  0.0 - 1.0 mg/dL   Nitrite NEGATIVE  NEGATIVE   Leukocytes, UA NEGATIVE  NEGATIVE  POC URINE PREG, ED      Result Value Ref Range   Preg Test, Ur NEGATIVE  NEGATIVE   MDM   Final diagnoses:  Nausea vomiting and diarrhea    Nausea, vomiting, diarrhea consistent with a viral gastroenteritis. She will be given IV hydration and IV ondansetron. Will also need to check pregnancy test.  She feels much better after above noted treatment. She will be discharged with prescription for ondansetron. Laboratory workup is significant for mild decrease in CO2 with normal anion gap which is probably indicative of dehydration and diarrhea. This should normalize with resumption of normal diet.  Dione Boozeavid Verlan Grotz, MD 07/19/13 [phone]

## 2013-07-19 NOTE — Discharge Instructions (Signed)
Nausea and Vomiting °Nausea is a sick feeling that often comes before throwing up (vomiting). Vomiting is a reflex where stomach contents come out of your mouth. Vomiting can cause severe loss of body fluids (dehydration). Children and elderly adults can become dehydrated quickly, especially if they also have diarrhea. Nausea and vomiting are symptoms of a condition or disease. It is important to find the cause of your symptoms. °CAUSES  °· Direct irritation of the stomach lining. This irritation can result from increased acid production (gastroesophageal reflux disease), infection, food poisoning, taking certain medicines (such as nonsteroidal anti-inflammatory drugs), alcohol use, or tobacco use. °· Signals from the brain. These signals could be caused by a headache, heat exposure, an inner ear disturbance, increased pressure in the brain from injury, infection, a tumor, or a concussion, pain, emotional stimulus, or metabolic problems. °· An obstruction in the gastrointestinal tract (bowel obstruction). °· Illnesses such as diabetes, hepatitis, gallbladder problems, appendicitis, kidney problems, cancer, sepsis, atypical symptoms of a heart attack, or eating disorders. °· Medical treatments such as chemotherapy and radiation. °· Receiving medicine that makes you sleep (general anesthetic) during surgery. °DIAGNOSIS °Your caregiver may ask for tests to be done if the problems do not improve after a few days. Tests may also be done if symptoms are severe or if the reason for the nausea and vomiting is not clear. Tests may include: °· Urine tests. °· Blood tests. °· Stool tests. °· Cultures (to look for evidence of infection). °· X-rays or other imaging studies. °Test results can help your caregiver make decisions about treatment or the need for additional tests. °TREATMENT °You need to stay well hydrated. Drink frequently but in small amounts. You may wish to drink water, sports drinks, clear broth, or eat frozen  ice pops or gelatin dessert to help stay hydrated. When you eat, eating slowly may help prevent nausea. There are also some antinausea medicines that may help prevent nausea. °HOME CARE INSTRUCTIONS  °· Take all medicine as directed by your caregiver. °· If you do not have an appetite, do not force yourself to eat. However, you must continue to drink fluids. °· If you have an appetite, eat a normal diet unless your caregiver tells you differently. °· Eat a variety of complex carbohydrates (rice, wheat, potatoes, bread), lean meats, yogurt, fruits, and vegetables. °· Avoid high-fat foods because they are more difficult to digest. °· Drink enough water and fluids to keep your urine clear or pale yellow. °· If you are dehydrated, ask your caregiver for specific rehydration instructions. Signs of dehydration may include: °· Severe thirst. °· Dry lips and mouth. °· Dizziness. °· Dark urine. °· Decreasing urine frequency and amount. °· Confusion. °· Rapid breathing or pulse. °SEEK IMMEDIATE MEDICAL CARE IF:  °· You have blood or brown flecks (like coffee grounds) in your vomit. °· You have black or bloody stools. °· You have a severe headache or stiff neck. °· You are confused. °· You have severe abdominal pain. °· You have chest pain or trouble breathing. °· You do not urinate at least once every 8 hours. °· You develop cold or clammy skin. °· You continue to vomit for longer than 24 to 48 hours. °· You have a fever. °MAKE SURE YOU:  °· Understand these instructions. °· Will watch your condition. °· Will get help right away if you are not doing well or get worse. °Document Released: 03/16/2005 Document Revised: 06/08/2011 Document Reviewed: 08/13/2010 °[employer]® Patient Information ©2014 [employer]. ° °Diarrhea °Diarrhea is frequent   loose and watery bowel movements. It can cause you to feel weak and dehydrated. Dehydration can cause you to become tired and thirsty, have a dry mouth, and have decreased urination that  often is dark yellow. Diarrhea is a sign of another problem, most often an infection that will not last long. In most cases, diarrhea typically lasts 2 3 days. However, it can last longer if it is a sign of something more serious. It is important to treat your diarrhea as directed by your caregive to lessen or prevent future episodes of diarrhea. °CAUSES  °Some common causes include: °· Gastrointestinal infections caused by viruses, bacteria, or parasites. °· Food poisoning or food allergies. °· Certain medicines, such as antibiotics, chemotherapy, and laxatives. °· Artificial sweeteners and fructose. °· Digestive disorders. °HOME CARE INSTRUCTIONS °· Ensure adequate fluid intake (hydration): have 1 cup (8 oz) of fluid for each diarrhea episode. Avoid fluids that contain simple sugars or sports drinks, fruit juices, whole milk products, and sodas. Your urine should be clear or pale yellow if you are drinking enough fluids. Hydrate with an oral rehydration solution that you can purchase at pharmacies, retail stores, and online. You can prepare an oral rehydration solution at home by mixing the following ingredients together: °·   tsp table salt. °· ¾ tsp baking soda. °·  tsp salt substitute containing potassium chloride. °· 1  tablespoons sugar. °· 1 L (34 oz) of water. °· Certain foods and beverages may increase the speed at which food moves through the gastrointestinal (GI) tract. These foods and beverages should be avoided and include: °· Caffeinated and alcoholic beverages. °· High-fiber foods, such as raw fruits and vegetables, nuts, seeds, and whole grain breads and cereals. °· Foods and beverages sweetened with sugar alcohols, such as xylitol, sorbitol, and mannitol. °· Some foods may be well tolerated and may help thicken stool including: °· Starchy foods, such as rice, toast, pasta, low-sugar cereal, oatmeal, grits, baked potatoes, crackers, and bagels. °· Bananas. °· Applesauce. °· Add probiotic-rich foods  to help increase healthy bacteria in the GI tract, such as yogurt and fermented milk products. °· Wash your hands well after each diarrhea episode. °· Only take over-the-counter or prescription medicines as directed by your caregiver. °· Take a warm bath to relieve any burning or pain from frequent diarrhea episodes. °SEEK IMMEDIATE MEDICAL CARE IF:  °· You are unable to keep fluids down. °· You have persistent vomiting. °· You have blood in your stool, or your stools are black and tarry. °· You do not urinate in 6 8 hours, or there is only a small amount of very dark urine. °· You have abdominal pain that increases or localizes. °· You have weakness, dizziness, confusion, or lightheadedness. °· You have a severe headache. °· Your diarrhea gets worse or does not get better. °· You have a fever or persistent symptoms for more than 2 3 days. °· You have a fever and your symptoms suddenly get worse. °MAKE SURE YOU:  °· Understand these instructions. °· Will watch your condition. °· Will get help right away if you are not doing well or get worse. °Document Released: 03/06/2002 Document Revised: 03/02/2012 Document Reviewed: 11/22/2011 °ExitCare® Patient Information ©2014 [employer]. ° °Ondansetron tablets °What is this medicine? °ONDANSETRON (on DAN se tron) is used to treat nausea and vomiting caused by chemotherapy. It is also used to prevent or treat nausea and vomiting after surgery. °This medicine may be used for other purposes; ask your health   care provider or pharmacist if you have questions. °COMMON BRAND NAME(S): Zofran °What should I tell my health care provider before I take this medicine? °They need to know if you have any of these conditions: °-heart disease °-history of irregular heartbeat °-liver disease °-low levels of magnesium or potassium in the blood °-an unusual or allergic reaction to ondansetron, granisetron, other medicines, foods, dyes, or preservatives °-pregnant or trying to get  pregnant °-breast-feeding °How should I use this medicine? °Take this medicine by mouth with a glass of water. Follow the directions on your prescription label. Take your doses at regular intervals. Do not take your medicine more often than directed. °Talk to your pediatrician regarding the use of this medicine in children. Special care may be needed. °Overdosage: If you think you have taken too much of this medicine contact a poison control center or emergency room at once. °NOTE: This medicine is only for you. Do not share this medicine with others. °What if I miss a dose? °If you miss a dose, take it as soon as you can. If it is almost time for your next dose, take only that dose. Do not take double or extra doses. °What may interact with this medicine? °Do not take this medicine with any of the following medications: °-apomorphine °-certain medicines for fungal infections like fluconazole, itraconazole, ketoconazole, posaconazole, voriconazole °-cisapride °-dofetilide °-dronedarone °-pimozide °-thioridazine °-ziprasidone  °This medicine may also interact with the following medications: °-carbamazepine °-certain medicines for depression, anxiety, or psychotic disturbances °-fentanyl °-linezolid °-MAOIs like Carbex, Eldepryl, Marplan, Nardil, and Parnate °-methylene blue (injected into a vein) °-other medicines that prolong the QT interval (cause an abnormal heart rhythm) °-phenytoin °-rifampicin °-tramadol °This list may not describe all possible interactions. Give your health care provider a list of all the medicines, herbs, non-prescription drugs, or dietary supplements you use. Also tell them if you smoke, drink alcohol, or use illegal drugs. Some items may interact with your medicine. °What should I watch for while using this medicine? °Check with your doctor or health care professional right away if you have any sign of an allergic reaction. °What side effects may I notice from receiving this medicine? °Side  effects that you should report to your doctor or health care professional as soon as possible: °-allergic reactions like skin rash, itching or hives, swelling of the face, lips or tongue °-breathing problems °-confusion °-dizziness °-fast or irregular heartbeat °-feeling faint or lightheaded, falls °-fever and chills °-loss of balance or coordination °-seizures °-sweating °-swelling of the hands or feet °-tightness in the chest °-tremors °-unusually weak or tired °Side effects that usually do not require medical attention (report to your doctor or health care professional if they continue or are bothersome): °-constipation or diarrhea °-headache °This list may not describe all possible side effects. Call your doctor for medical advice about side effects. You may report side effects to FDA at 1-800-FDA-1088. °Where should I keep my medicine? °Keep out of the reach of children. °Store between 2 and 30 degrees C (36 and 86 degrees F). Throw away any unused medicine after the expiration date. °NOTE: This sheet is a summary. It may not cover all possible information. If you have questions about this medicine, talk to your doctor, pharmacist, or health care provider. °© 2014, [employer]. (2012-12-21 16:27:45) ° °

## 2013-07-19 NOTE — ED Notes (Signed)
Pt. States that last night around 2000 she wasn't feeling well. She woke up around 0430 with N/VD. States she has been vomiting since time.Diarrhea 4-5 times. Denies fever, chills or sweats.

## 2013-07-19 NOTE — ED Notes (Signed)
Pt reports n/v that started yesterday. Pt recently seen in ED yesterday and still not feeling any better. Pt states that she continues to dry heave and is not able to keep anything down. Vomiting continues reports pt. Pt states that she has chest pain mid chest that gets worse with vomiting. Pt alert in triage.

## 2013-07-20 LAB — I-STAT CHEM 8, ED
BUN: 6 mg/dL (ref 6–23)
CHLORIDE: 105 meq/L (ref 96–112)
Calcium, Ion: 1.22 mmol/L (ref 1.12–1.23)
Creatinine, Ser: 1 mg/dL (ref 0.50–1.10)
GLUCOSE: 87 mg/dL (ref 70–99)
HEMATOCRIT: 37 % (ref 36.0–46.0)
HEMOGLOBIN: 12.6 g/dL (ref 12.0–15.0)
POTASSIUM: 3.4 meq/L — AB (ref 3.7–5.3)
SODIUM: 142 meq/L (ref 137–147)
TCO2: 22 mmol/L (ref 0–100)

## 2013-07-20 MED ORDER — METOCLOPRAMIDE HCL 5 MG/ML IJ SOLN
10.0000 mg | Freq: Once | INTRAMUSCULAR | Status: AC
  Administered 2013-07-20: 10 mg via INTRAVENOUS
  Filled 2013-07-20: qty 2

## 2013-07-20 MED ORDER — POTASSIUM CHLORIDE 20 MEQ/15ML (10%) PO LIQD
40.0000 meq | Freq: Once | ORAL | Status: AC
  Administered 2013-07-20: 40 meq via ORAL
  Filled 2013-07-20: qty 30

## 2013-07-20 MED ORDER — SODIUM CHLORIDE 0.9 % IV SOLN
80.0000 mg | Freq: Once | INTRAVENOUS | Status: AC
  Administered 2013-07-20: 80 mg via INTRAVENOUS
  Filled 2013-07-20: qty 80

## 2013-07-20 MED ORDER — PROMETHAZINE HCL 25 MG PO TABS: 25.0000 mg | ORAL_TABLET | Freq: Four times a day (QID) | ORAL | Status: DC | PRN

## 2013-07-20 MED ORDER — DIPHENHYDRAMINE HCL 50 MG/ML IJ SOLN
25.0000 mg | Freq: Once | INTRAMUSCULAR | Status: AC
  Administered 2013-07-20: 25 mg via INTRAVENOUS
  Filled 2013-07-20: qty 1

## 2013-07-20 NOTE — ED Notes (Signed)
Pt reports arm is itching.

## 2013-07-20 NOTE — ED Provider Notes (Signed)
Medical screening examination/treatment/procedure(s) were performed by non-physician practitioner and as supervising physician I was immediately available for consultation/collaboration.   EKG Interpretation None       Saralee Bolick M OtterOlivia Mackie, MD 07/20/13 [phone]

## 2013-07-20 NOTE — ED Provider Notes (Signed)
CSN: [id number]     Arrival date & time 07/19/13  2213 History   First MD Initiated Contact with Patient 07/19/13 2312     Chief Complaint  Patient presents with  . Emesis  . Chest Pain     (Consider location/radiation/quality/duration/timing/severity/associated sxs/prior Treatment) The history is provided by the patient and medical records. No language interpreter was used.    Joanne Hanna is a 33 y.o. female  with a hx of appendectomy, sciatica presents to the Emergency Department complaining of gradual, persistent, progressively worsening nausea onset yesterday evening and vomiting beginning at 4:30am.  Pt [facility].  Pt reports she was able to keep fluids down there but when she went home she developed nausea again.  She reports dry heaving at home and vomiting anything that she attempts to eat or drink.  Pt also reports anterior center chest pain that occurs only with dry heaving.  She reports no pain at rest.  Pt reports NBNB emesis.  Pt reports diarrhea earlier, but it was without melena or hematochezia and has largely resolved. Pt reports trying to eat soup and crackers without ability to hold it down. She reports taking the Zofran with water (swallowed the ODT pill as opposed to letting it dissolve). NO aggravating or alleviating factors. Pt denies fever, chills, headache, neck pain, SOB, weakness, dizziness, syncope, dysuria.  Pr reports her stomach feels on fire inside.       Past Medical History  Diagnosis Date  . History of appendectomy 1999  . Abscess of axilla   . Lumbar back sprain   . Sciatica    Past Surgical History  Procedure Laterality Date  . Appendectomy     History reviewed. No pertinent family history. History  Substance Use Topics  . Smoking status: Never Smoker   . Smokeless tobacco: Not on file  . Alcohol Use: Yes     Comment: occasionally   OB History   Grav Para Term Preterm Abortions TAB SAB Ect Mult Living                  Review of Systems  Constitutional: Negative for fever, diaphoresis, appetite change, fatigue and unexpected weight change.  HENT: Negative for mouth sores.   Eyes: Negative for visual disturbance.  Respiratory: Negative for cough, chest tightness, shortness of breath and wheezing.   Cardiovascular: Positive for chest pain (only with vomiting).  Gastrointestinal: Positive for nausea and vomiting. Negative for abdominal pain, diarrhea and constipation.  Endocrine: Negative for polydipsia, polyphagia and polyuria.  Genitourinary: Negative for dysuria, urgency, frequency and hematuria.  Musculoskeletal: Negative for back pain and neck stiffness.  Skin: Negative for rash.  Allergic/Immunologic: Negative for immunocompromised state.  Neurological: Negative for syncope, light-headedness and headaches.  Hematological: Does not bruise/bleed easily.  Psychiatric/Behavioral: Negative for sleep disturbance. The patient is not nervous/anxious.       Allergies  Aspirin; Food; Motrin; Latex; and Penicillins  Home Medications   Prior to Admission medications   Medication Sig Start Date End Date Taking? Authorizing Provider  Multiple Vitamin (MULTIVITAMIN WITH MINERALS) TABS tablet Take 1 tablet by mouth daily.   Yes Historical Provider, MD  ondansetron (ZOFRAN) 4 MG tablet Take 1 tablet (4 mg total) by mouth every 6 (six) hours as needed for nausea or vomiting. 07/19/13  Yes Dione Boozeavid Glick, MD   BP 113/76  Pulse 55  Temp(Src) 97.8 F (36.6 C) (Oral)  Resp 18  SpO2 100%  LMP 07/11/2013 Physical Exam  Nursing note and vitals reviewed. Constitutional: She is oriented to person, place, and time. She appears well-developed and well-nourished. No distress.  Awake, alert, nontoxic appearance  HENT:  Head: Normocephalic and atraumatic.  Mouth/Throat: Oropharynx is clear and moist. No oropharyngeal exudate.  Eyes: Conjunctivae and EOM are normal. Pupils are equal, round, and  reactive to light. No scleral icterus.  Neck: Normal range of motion. Neck supple.  Cardiovascular: Normal rate, regular rhythm, normal heart sounds and intact distal pulses.   No murmur heard. RRR  Pulmonary/Chest: Effort normal and breath sounds normal. No respiratory distress. She has no wheezes. She exhibits tenderness.  Clear and equal breath sounds Pain to palpation of the anterior chest  Abdominal: Soft. Bowel sounds are normal. She exhibits no distension and no mass. There is no tenderness. There is no rebound and no guarding.  abd soft and nontender  Musculoskeletal: Normal range of motion. She exhibits no edema.  Lymphadenopathy:    She has no cervical adenopathy.  Neurological: She is alert and oriented to person, place, and time. She exhibits normal muscle tone. Coordination normal.  Speech is clear and goal oriented Moves extremities without ataxia  Skin: Skin is warm and dry. She is not diaphoretic. No erythema.  Psychiatric: She has a normal mood and affect.    ED Course  Procedures (including critical care time) Labs Review Labs Reviewed  I-STAT CHEM 8, ED - Abnormal; Notable for the following:    Potassium 3.4 (*)    All other components within normal limits    Imaging Review No results found.   EKG Interpretation None      MDM   Final diagnoses:  Nausea and vomiting   Joanne BeamJayna Thompson presents with N/V/D and reproducible chest pain.  Pt was seen at 5AM this morning for the same. Record review shows that patient stable on that evaluation. She had no leukocytosis or other concerning lab findings. Her urinalysis was without evidence of urinary tract infection and her pregnancy test was negative.  Patient has not been taking her Zofran correctly. We'll give IV fluids, recheck electrolytes and reassess.  12:59 AM Patient reports improvement in nausea. No further emesis in the department. Repeat electrolytes with mild hypokalemia at 3.4.  We'll replete orally and  by mouth trial.    1:48 AM Pt vomited a very small amount of PO after drinking 2 large glasses of apple juice.  Will give Reglan and reassess.  On recheck, pt abd remains soft and nontender.    Pt discussed with Antony MaduraKelly Humes, PA-C.  If she can tolerate PO she may be discharged at home.    BP 113/76  Pulse 55  Temp(Src) 97.8 F (36.6 C) (Oral)  Resp 18  SpO2 100%  LMP 07/11/2013   Dierdre ForthHannah Ardine Iacovelli, PA-C 07/20/13 1504

## 2013-07-20 NOTE — ED Notes (Signed)
Pt able to tolerate apple juice without emesis.

## 2013-07-20 NOTE — Discharge Instructions (Signed)
1. Medications: phenergan, usual home medications 2. Treatment: rest, drink plenty of fluids,  3. Follow Up: Please followup with your primary doctor for discussion of your diagnoses and further evaluation after today's visit; if you do not have a primary care doctor use the resource guide provided to find one;     Nausea and Vomiting Nausea means you feel sick to your stomach. Throwing up (vomiting) is a reflex where stomach contents come out of your mouth. HOME CARE   Take medicine as told by your doctor.  Do not force yourself to eat. However, you do need to drink fluids.  If you feel like eating, eat a normal diet as told by your doctor.  Eat rice, wheat, potatoes, bread, lean meats, yogurt, fruits, and vegetables.  Avoid high-fat foods.  Drink enough fluids to keep your pee (urine) clear or pale yellow.  Ask your doctor how to replace body fluid losses (rehydrate). Signs of body fluid loss (dehydration) include:  Feeling very thirsty.  Dry lips and mouth.  Feeling dizzy.  Dark pee.  Peeing less than normal.  Feeling confused.  Fast breathing or heart rate. GET HELP RIGHT AWAY IF:   You have blood in your throw up.  You have black or bloody poop (stool).  You have a bad headache or stiff neck.  You feel confused.  You have bad belly (abdominal) pain.  You have chest pain or trouble breathing.  You do not pee at least once every 8 hours.  You have cold, clammy skin.  You keep throwing up after 24 to 48 hours.  You have a fever. MAKE SURE YOU:   Understand these instructions.  Will watch your condition.  Will get help right away if you are not doing well or get worse. Document Released: 09/02/2007 Document Revised: 06/08/2011 Document Reviewed: 08/15/2010 [facility] Patient Information 2014 [place], [place].

## 2013-07-20 NOTE — ED Provider Notes (Signed)
Medical screening examination/treatment/procedure(s) were performed by non-physician practitioner and as supervising physician I was immediately available for consultation/collaboration.   EKG Interpretation None       Giabella Duhart M Treanna Dumler, MD 07/20/13 2040 

## 2013-07-20 NOTE — ED Provider Notes (Signed)
0130 - Patient care [facility] Mut[facility], PA-C at shift change. Patient pending PO challenge. Plan discussed with Muthersbaugh, PA-C which includes discharge if patient able to tolerate fluids.  0230 - Patient able to tolerate by mouth fluids. She feels stable for discharge. Return precautions provided and patient agreeable to plan.   Filed Vitals:   07/19/13 2233  BP: 132/74  Pulse: 61  Temp: 97.9 F (36.6 C)  TempSrc: Oral  Resp: 22  SpO2: 100%     Antony MaduraKelly Dianca Owensby, PA-C 07/20/13 [id number]

## 2013-11-29 ENCOUNTER — Emergency Department (HOSPITAL_COMMUNITY)
Admission: EM | Admit: 2013-11-29 | Discharge: 2013-11-29 | Disposition: A | Discharge status: Home / Self Care | Payer: Medicaid Other | Attending: Emergency Medicine | Admitting: Emergency Medicine

## 2013-11-29 ENCOUNTER — Encounter (HOSPITAL_COMMUNITY): Payer: Self-pay | Admitting: Emergency Medicine

## 2013-11-29 DIAGNOSIS — Z872 Personal history of diseases of the skin and subcutaneous tissue: Secondary | ICD-10-CM | POA: Insufficient documentation

## 2013-11-29 DIAGNOSIS — Z9089 Acquired absence of other organs: Secondary | ICD-10-CM | POA: Insufficient documentation

## 2013-11-29 DIAGNOSIS — Z91040 Latex allergy status: Secondary | ICD-10-CM | POA: Insufficient documentation

## 2013-11-29 DIAGNOSIS — Z87828 Personal history of other (healed) physical injury and trauma: Secondary | ICD-10-CM | POA: Insufficient documentation

## 2013-11-29 DIAGNOSIS — M25512 Pain in left shoulder: Secondary | ICD-10-CM

## 2013-11-29 DIAGNOSIS — Z79899 Other long term (current) drug therapy: Secondary | ICD-10-CM | POA: Insufficient documentation

## 2013-11-29 DIAGNOSIS — R209 Unspecified disturbances of skin sensation: Secondary | ICD-10-CM | POA: Insufficient documentation

## 2013-11-29 DIAGNOSIS — M25519 Pain in unspecified shoulder: Secondary | ICD-10-CM | POA: Insufficient documentation

## 2013-11-29 DIAGNOSIS — Z880 Allergy status to penicillin: Secondary | ICD-10-CM | POA: Insufficient documentation

## 2013-11-29 MED ORDER — DIPHENHYDRAMINE HCL 25 MG PO CAPS
25.0000 mg | ORAL_CAPSULE | Freq: Once | ORAL | Status: AC
  Administered 2013-11-29: 25 mg via ORAL
  Filled 2013-11-29: qty 1

## 2013-11-29 MED ORDER — NAPROXEN 250 MG PO TABS
500.0000 mg | ORAL_TABLET | Freq: Once | ORAL | Status: AC
  Administered 2013-11-29: 500 mg via ORAL
  Filled 2013-11-29: qty 2

## 2013-11-29 NOTE — ED Provider Notes (Signed)
CSN: [id number]     Arrival date & time 11/29/13  7829 History   First MD Initiated Contact with Patient 11/29/13 [phone]     Chief Complaint  Patient presents with  . Shoulder Pain  . Arm Pain     (Consider location/radiation/quality/duration/timing/severity/associated sxs/prior Treatment) HPI Comments: Pt states she was pushing a car 2 days ago, then yesterday she reached up for something and has had shoulder pain since then.   Patient is a 33 y.o. female presenting with shoulder pain. The history is provided by the patient. No language interpreter was used.  Shoulder Pain This is a new problem. The current episode started yesterday. The problem occurs constantly. The problem has not changed since onset.Pertinent negatives include no chest pain, no abdominal pain, no headaches and no shortness of breath. Exacerbated by: movment of shoulder. The symptoms are relieved by rest. She has tried acetaminophen for the symptoms. The treatment provided no relief.    Past Medical History  Diagnosis Date  . History of appendectomy 1999  . Abscess of axilla   . Lumbar back sprain   . Sciatica    Past Surgical History  Procedure Laterality Date  . Appendectomy     History reviewed. No pertinent family history. History  Substance Use Topics  . Smoking status: Never Smoker   . Smokeless tobacco: Not on file  . Alcohol Use: Yes     Comment: occasionally   OB History   Grav Para Term Preterm Abortions TAB SAB Ect Mult Living                 Review of Systems  Constitutional: Negative for fever, chills, diaphoresis, activity change, appetite change and fatigue.  HENT: Negative for congestion, facial swelling, rhinorrhea and sore throat.   Eyes: Negative for photophobia and discharge.  Respiratory: Negative for cough, chest tightness and shortness of breath.   Cardiovascular: Negative for chest pain, palpitations and leg swelling.  Gastrointestinal: Negative for nausea, vomiting, abdominal  pain and diarrhea.  Endocrine: Negative for polydipsia and polyuria.  Genitourinary: Negative for dysuria, frequency, difficulty urinating and pelvic pain.  Musculoskeletal: Negative for arthralgias, back pain, neck pain and neck stiffness.  Skin: Negative for color change and wound.  Allergic/Immunologic: Negative for immunocompromised state.  Neurological: Negative for facial asymmetry, weakness, numbness and headaches.  Hematological: Does not bruise/bleed easily.  Psychiatric/Behavioral: Negative for confusion and agitation.      Allergies  Aspirin; Food; Motrin; Latex; and Penicillins  Home Medications   Prior to Admission medications   Medication Sig Start Date End Date Taking? Authorizing Provider  Multiple Vitamin (MULTIVITAMIN WITH MINERALS) TABS tablet Take 1 tablet by mouth daily.    Historical Provider, MD  ondansetron (ZOFRAN) 4 MG tablet Take 1 tablet (4 mg total) by mouth every 6 (six) hours as needed for nausea or vomiting. 07/19/13   Dione Booze, MD  promethazine (PHENERGAN) 25 MG tablet Take 1 tablet (25 mg total) by mouth every 6 (six) hours as needed for nausea or vomiting. 07/20/13   Dahlia Client Muthersbaugh, PA-C   BP 111/71  Pulse 70  Temp(Src) 97.4 F (36.3 C) (Oral)  Resp 16  SpO2 100%  LMP 10/26/2013 Physical Exam  Constitutional: She is oriented to person, place, and time. She appears well-developed and well-nourished. No distress.  HENT:  Head: Normocephalic and atraumatic.  Mouth/Throat: No oropharyngeal exudate.  Eyes: Pupils are equal, round, and reactive to light.  Neck: Normal range of motion. Neck supple.  Cardiovascular: Normal  rate, regular rhythm and normal heart sounds.  Exam reveals no gallop and no friction rub.   No murmur heard. Pulmonary/Chest: Effort normal and breath sounds normal. No respiratory distress. She has no wheezes. She has no rales.  Abdominal: Soft. Bowel sounds are normal. She exhibits no distension and no mass. There is no  tenderness. There is no rebound and no guarding.  Musculoskeletal: She exhibits no edema and no tenderness.       Left shoulder: She exhibits decreased range of motion (due to pain), pain and decreased strength (due to pain). She exhibits no tenderness, no bony tenderness, no swelling, no effusion, no deformity, no laceration, no spasm and normal pulse.       Arms: Neurological: She is alert and oriented to person, place, and time.  Skin: Skin is warm and dry.  Psychiatric: She has a normal mood and affect.    ED Course  Procedures (including critical care time) Labs Review Labs Reviewed - No data to display  Imaging Review No results found.   EKG Interpretation None      MDM   Final diagnoses:  Left shoulder pain    Pt is a 33 y.o. female with Pmhx as above who presents with sudden onset L anterior shoulder pain after reaching forward for something yesterday. She had also pushed a car 2 days ago.  She denies CP, SOB, neck pain, back pain, h/a. She reports paresthesias in L lower arm.  On PE, VSS, pt in NAD.  No reproducible pain w/ palpation. Strength dec due to pain, but overall motor in intact. She has had w/ all directions of ROM of L shoulder.  Shoulder does not appears dislocated and she can reach R shoulder. Doubt acute bony injury given mechanism. Suspect strain/sprain or possible radiculopathy.  Pt does not want narcotics, or steroids. Will place in sling for comfort. Have rec naproxen or similar for pain which she states she can take w/ benadryl.  Return precautions given for new or worsening symptoms including worsening pain, worsening paresthesias.          Toy Cookey, MD 11/29/13 [phone]

## 2013-11-29 NOTE — Discharge Instructions (Signed)
Shoulder Pain  The shoulder is the joint that connects your arm to your body. Muscles and band-like tissues that connect bones to muscles (tendons) hold the joint together. Shoulder pain is felt if an injury or medical problem affects one or more parts of the shoulder.  HOME CARE   · Put ice on the sore area.  ¨ Put ice in a plastic bag.  ¨ Place a towel between your skin and the bag.  ¨ Leave the ice on for 15-20 minutes, 03-04 times a day for the first 2 days.  · Stop using cold packs if they do not help with the pain.  · If you were given something to keep your shoulder from moving (sling; shoulder immobilizer), wear it as told. Only take it off to shower or bathe.  · Move your arm as little as possible, but keep your hand moving to prevent puffiness (swelling).  · Squeeze a soft ball or foam pad as much as possible to help prevent swelling.  · Take medicine as told by your doctor.  GET HELP IF:  · You have progressing new pain in your arm, hand, or fingers.  · Your hand or fingers get cold.  · Your medicine does not help lessen your pain.  GET HELP RIGHT AWAY IF:   · Your arm, hand, or fingers are numb or tingling.  · Your arm, hand, or fingers are puffy (swollen), painful, or turn white or blue.  MAKE SURE YOU:   · Understand these instructions.  · Will watch your condition.  · Will get help right away if you are not doing well or get worse.  Document Released: 09/02/2007 Document Revised: 07/31/2013 Document Reviewed: 09/28/2011  Exit[employer]® Patient Information ©2015 [employer]. This information is not intended to replace advice given to you by your health care provider. Make sure you discuss any questions you have with your health care provider.

## 2013-11-29 NOTE — ED Notes (Signed)
Pt from home with c/o left shoulder pain and arm tingling starting last pm after reaching for something at work.  Pt states the pain increases with movement and turning of her head from side to side.  Denies SOB, or chest pain, no cardiac hx.  Pt in NAD, A&O.

## 2014-01-17 ENCOUNTER — Encounter (HOSPITAL_COMMUNITY): Payer: Self-pay | Admitting: Emergency Medicine

## 2014-01-17 ENCOUNTER — Emergency Department (HOSPITAL_COMMUNITY)
Admission: EM | Admit: 2014-01-17 | Discharge: 2014-01-17 | Disposition: A | Discharge status: Home / Self Care | Payer: Medicaid Other | Attending: Emergency Medicine | Admitting: Emergency Medicine

## 2014-01-17 DIAGNOSIS — R112 Nausea with vomiting, unspecified: Secondary | ICD-10-CM | POA: Insufficient documentation

## 2014-01-17 DIAGNOSIS — Z87828 Personal history of other (healed) physical injury and trauma: Secondary | ICD-10-CM | POA: Insufficient documentation

## 2014-01-17 DIAGNOSIS — Z9889 Other specified postprocedural states: Secondary | ICD-10-CM | POA: Insufficient documentation

## 2014-01-17 DIAGNOSIS — Z872 Personal history of diseases of the skin and subcutaneous tissue: Secondary | ICD-10-CM | POA: Insufficient documentation

## 2014-01-17 DIAGNOSIS — R51 Headache: Secondary | ICD-10-CM | POA: Insufficient documentation

## 2014-01-17 DIAGNOSIS — H538 Other visual disturbances: Secondary | ICD-10-CM | POA: Insufficient documentation

## 2014-01-17 DIAGNOSIS — R519 Headache, unspecified: Secondary | ICD-10-CM

## 2014-01-17 DIAGNOSIS — Z880 Allergy status to penicillin: Secondary | ICD-10-CM | POA: Insufficient documentation

## 2014-01-17 DIAGNOSIS — Z8739 Personal history of other diseases of the musculoskeletal system and connective tissue: Secondary | ICD-10-CM | POA: Insufficient documentation

## 2014-01-17 DIAGNOSIS — Z91040 Latex allergy status: Secondary | ICD-10-CM | POA: Insufficient documentation

## 2014-01-17 MED ORDER — METOCLOPRAMIDE HCL 5 MG/ML IJ SOLN
10.0000 mg | Freq: Once | INTRAMUSCULAR | Status: AC
  Administered 2014-01-17: 10 mg via INTRAVENOUS
  Filled 2014-01-17: qty 2

## 2014-01-17 MED ORDER — DIPHENHYDRAMINE HCL 50 MG/ML IJ SOLN
25.0000 mg | Freq: Once | INTRAMUSCULAR | Status: AC
  Administered 2014-01-17: 25 mg via INTRAVENOUS
  Filled 2014-01-17: qty 1

## 2014-01-17 MED ORDER — SODIUM CHLORIDE 0.9 % IV BOLUS (SEPSIS)
1000.0000 mL | Freq: Once | INTRAVENOUS | Status: AC
  Administered 2014-01-17: 1000 mL via INTRAVENOUS

## 2014-01-17 NOTE — Discharge Instructions (Signed)
Read the information below.  You may return to the Emergency Department at any time for worsening condition or any new symptoms that concern you.    You are having a headache. No specific cause was found today for your headache. It may have been a migraine or other cause of headache. Stress, anxiety, fatigue, and depression are common triggers for headaches. Your headache today does not appear to be life-threatening or require hospitalization, but often the exact cause of headaches is not determined in the emergency department. Therefore, follow-up with your doctor is very important to find out what may have caused your headache, and whether or not you need any further diagnostic testing or treatment. Sometimes headaches can appear benign (not harmful), but then more serious symptoms can develop which should prompt an immediate re-evaluation by your doctor or the emergency department. SEEK MEDICAL ATTENTION IF: You develop possible problems with medications prescribed.  The medications don't resolve your headache, if it recurs , or if you have multiple episodes of vomiting or can't take fluids. You have a change from the usual headache. RETURN IMMEDIATELY IF you develop a sudden, severe headache or confusion, become poorly responsive or faint, develop a fever above 100.66F or problem breathing, have a change in speech, vision, swallowing, or understanding, or develop new weakness, numbness, tingling, incoordination, or have a seizure.    General Headache Without Cause A headache is pain or discomfort felt around the head or neck area. The specific cause of a headache may not be found. There are many causes and types of headaches. A few common ones are:  Tension headaches.  Migraine headaches.  Cluster headaches.  Chronic daily headaches. HOME CARE INSTRUCTIONS   Keep all follow-up appointments with your caregiver or any specialist referral.  Only take over-the-counter or prescription medicines  for pain or discomfort as directed by your caregiver.  Lie down in a dark, quiet room when you have a headache.  Keep a headache journal to find out what may trigger your migraine headaches. For example, write down:  What you eat and drink.  How much sleep you get.  Any change to your diet or medicines.  Try massage or other relaxation techniques.  Put ice packs or heat on the head and neck. Use these 3 to 4 times per day for 15 to 20 minutes each time, or as needed.  Limit stress.  Sit up straight, and do not tense your muscles.  Quit smoking if you smoke.  Limit alcohol use.  Decrease the amount of caffeine you drink, or stop drinking caffeine.  Eat and sleep on a regular schedule.  Get 7 to 9 hours of sleep, or as recommended by your caregiver.  Keep lights dim if bright lights bother you and make your headaches worse. SEEK MEDICAL CARE IF:   You have problems with the medicines you were prescribed.  Your medicines are not working.  You have a change from the usual headache.  You have nausea or vomiting. SEEK IMMEDIATE MEDICAL CARE IF:   Your headache becomes severe.  You have a fever.  You have a stiff neck.  You have loss of vision.  You have muscular weakness or loss of muscle control.  You start losing your balance or have trouble walking.  You feel faint or pass out.  You have severe symptoms that are different from your first symptoms. MAKE SURE YOU:   Understand these instructions.  Will watch your condition.  Will get help right away  if you are not doing well or get worse. Document Released: 03/16/2005 Document Revised: 06/08/2011 Document Reviewed: 04/01/2011 [facility] Patient Information 2015 [place], [place]. This information is not intended to replace advice given to you by your health care provider. Make sure you discuss any questions you have with your health care provider.   Emergency Department Resource Guide 1) Find a Doctor and  Pay Out of Pocket Although you won't have to find out who is covered by your insurance plan, it is a good idea to ask around and get recommendations. You will then need to call the office and see if the doctor you have chosen will accept you as a new patient and what types of options they offer for patients who are self-pay. Some doctors offer discounts or will set up payment plans for their patients who do not have insurance, but you will need to ask so you aren't surprised when you get to your appointment.  2) Contact Your Local Health Department Not all health departments have doctors that can see patients for sick visits, but many do, so it is worth a call to see if yours does. If you don't know where your local health department is, you can check in your phone book. The CDC also has a tool to help you locate your state's health department, and many state websites also have listings of all of their local health departments.  3) Find a Walk-in Clinic If your illness is not likely to be very severe or complicated, you may want to try a walk in clinic. These are popping up all over the country in pharmacies, drugstores, and shopping centers. They're usually staffed by nurse practitioners or physician assistants that have been trained to treat common illnesses and complaints. They're usually fairly quick and inexpensive. However, if you have serious medical issues or chronic medical problems, these are probably not your best option.  No Primary Care Doctor: - Call Health Connect at  [phone] - they can help you locate a primary care doctor that  accepts your insurance, provides certain services, etc. - Physician Referral Service- [phone]  Chronic Pain Problems: Organization         Address  Phone   Notes  [facility]  [phone] Patients need to be referred by their primary care doctor.   Medication Assistance: Organization         Address  Phone   Notes  [facility] [address] [place], [place] [address] [phone] --Must be a resident of [place] -- Must have NO insurance coverage whatsoever (no Medicaid/ Medicare, etc.) -- The pt. MUST have a primary care doctor that directs their care regularly and follows them in the community   Med[facility]ist  [phone]   [employer]  [phone]    Agencies that provide inexpensive medical care: Organization         Address  Phone   Notes  [facility]  [phone]) [phone]   [facility]    [phone]   [facility] [address] [place], [place] [address] [phone]) [phone]   [facility] [address], [place] [phone]   [facility]anned Parenthood    [phone](336) [phone]   [facility]    [phone](336) [phone]   [facility]  [address], [place] Phone:  [phone]) [phone], Fax:  [phone]) [phone] Hours  of Operation:  9 am - 6 pm, M-F.  Also accepts Medicaid/Medicare and self-pay.  [facility]  [address], [place] Phone: [phone], Fax: [phone]. Hours of Operation:  8:30 am - 5:30 pm, M-F.  Also accepts Medicaid and self-pay.  [facility] [place] [address], [place] Phone: [phone]   [facility] [address] Natasha [place], [place] [phone], Ext. [phone] Mondays & Thursdays: 7-9 AM.  First 15 patients are seen on a first come, first serve basis.    Medicaid-accepting [facility] Providers:  Organization         Address  Phone   Notes  [facility] [address], [place] [phone] Also accepts self-pay patients.  [facility] [address] Laurell Josephs [place], [place]  [phone]   [facility] [address], [place] [phone]   [facility] [address] [place] [phone]   Renaye Rakers [address], [place]   [phone] Only accepts [place] Access [place] patients after they have their name applied to their card.   Self-Pay (no insurance) in [facility]:  Organization         Address  Phone   Notes  Sickle Cell Patients, [facility] [address] [place], [place] [phone]   [facility] [address] [place], [place] [phone]   [facility]  [address],  [phone]   [facility]/Dr. Osei-Bonsu  [address] [place] or [address], [place] [phone] Phone number for both [place] and [place] locations is the same.  [facility] [address], [place] [phone]   [facility] [address] [place] or [address] [phone]   [facility] [address] [place] [phone], phone; [phone], fax Sees patients 1st and 3rd Saturday of every month.  Must not qualify for public or private insurance (i.e. Medicaid, Medicare, [place] Health Choice, Veterans' Benefits)  Household income should be no more than 200% of the poverty level The clinic cannot treat you if you are pregnant or think you are pregnant  Sexually transmitted diseases are not treated at the clinic.    Dental Care: Organization         Address  Phone  Notes  [facility] [address] [place], [place] [phone] Accepts children up to age 28 who are enrolled in [place] or [place] Health Choice; pregnant women with a Medicaid card; and children who have applied for Medicaid or [place] Health Choice, but were declined, whose parents can pay a reduced fee at time of service.  [facility]  [address], [place] [phone] Accepts children up to age 52 who are enrolled in [place] or [place] Health Choice; pregnant women with a Medicaid card; and children who have applied for Medicaid or [place] Health Choice, but were declined, whose parents can pay a reduced fee at time of service.  [facility]  [address] [place], [place] [phone] Patients are seen by appointment only. Walk-ins are not accepted. [facility] will see  patients 50 years of age and older. Monday - Tuesday (8am-5pm) Most Wednesdays (8:30-5pm) $30 per visit, cash only  [facility] Adult Dental Access [facility]  [address], [facility] [phone] Patients are seen by appointment only. Walk-ins are not accepted. [facility] will see patients 80 years of age and older. One Wednesday Evening (Monthly: Volunteer Based).  $30 per visit, cash only  [employer]  [phone] for adults; Children under age 62, call Graduate Pediatric Dentistry at [phone]. Children aged 106-14, please call [phone] to request a pediatric application.  Dental services are provided in all areas of dental care including fillings, crowns and bridges, complete and partial dentures, implants, gum treatment, root canals, and extractions. Preventive care is also provided. Treatment is provided to both adults and children. Patients are selected via a lottery and there is often a waiting list.   [facility] [address] [place]  [phone] [email or web address]   [facility] [address] [place], [place] [phone], Ext. [phone] Second and Fourth Thursday of each month, opens at 6:30 AM; Clinic ends at 9 AM.  Patients are seen on a first-come first-served basis, and a limited number are seen during each clinic.   [facility]  [address] Ether [place]ins [place], [place] [phone]   Eligibility Requirements You must have lived in [place], [place], or  [place] counties for at least the last three months.   You cannot be eligible for state or federal sponsored [place], including [employer], [place], or [employer].   You generally cannot be eligible for healthcare insurance through your employer.    How to apply: Eligibility screenings are held every Tuesday and Wednesday afternoon from 1:00 pm until 4:00 pm. You do not need an appointment for the interview!  [facility] [address], [place], [place] [phone]   [facility] Health Department  [phone]   [facility] Health Department  [phone]   [facility] Department  [phone]    Behavioral Health Resources in the Community: Intensive Outpatient Programs Organization         Address  Phone  Notes  [facility] [address] [place], [place] [phone]   [facility] Outpatient [address], [place], [place] [phone]   [facility] [address] [place], [place]  [phone]   [facility] [address]  [place], [place] [phone] or [phone]   Substance Abuse Resources Organization         Address  Phone  Notes  Al[facility] Drug Services  [phone]   [facility]  [phone]   [facility] [place]  [phone]   Floydene Flock  [phone]   Residential & Outpatient Substance Abuse Program  [phone]   Psychological Services Organization         Address  Phone  Notes  [facility]  [phone]   [employer] Services  [phone]   [facility] Mental Health [address], [place] [phone] or [phone]    Mobile Crisis Teams Organization         Address  Phone  Notes  [facility], Mobile Crisis Care Unit  [phone]   [facility]  [address] [place], [place] [phone]   [facility] [address] [place] [phone]    Self-Help/Support Groups Organization  Address  Phone             Notes  [facility] [place] - variety of support groups  336- I7437963 Call for more information  Narcotics Anonymous (NA), [facility] [address], [employer] [place]  2 meetings at this location   Statistician         Address  Phone  Notes  [facility] [address],    [place]  [phone]   [facility]  [address], [place] [address], [place], [place] [phone]   [facility] [address] [place], [place] [phone] Admissions: 8am-3pm M-F  [facility] [address]    [place], [place] [phone]   [facility] [address] [place], [place], [place] [phone]   [facility] [address]  [place], [place] [phone]   [facility] - Intensive Outpatient [address] Laurell Josephs [address], [place], [place] [phone]   [facility] ([facility]iction Recovery [facility] [address] [place].,  [place], [place] [phone] or [phone]   [facility] ([facility]) [address], [place], [place] [phone] Accepts Medicaid  [facility] [place] [address],  [place] [phone] Substance Abuse/Addiction Treatment   [facility] Organization         Address  Phone  Notes  [facility]  [phone]   Angie Fava, PhD [address] Ervin Knack [place], [place]   [phone] or [phone]   [facility]   [address] [place], [place] [phone]   [facility] [address], [place], [place] [phone] Insurance/Medicaid/sponsorship through [facility] and Families [address]                                    [place], [place] [phone] Therapy/tele-psych/case  [facility] [address] [place], [place] [phone]     Dr. Lolly Mustache  [phone]   [facility]  [employer] [facility] Dept. 1) [address], [place] 2) [address] [place] 3)  [address], [place] [phone]  [phone]   [facility] Child Abuse Hotline [phone] or 4125859314 (After Hours)

## 2014-01-17 NOTE — ED Provider Notes (Signed)
CSN: [id number]     Arrival date & time 01/17/14  [id number] History   First MD Initiated Contact with Patient 01/17/14 0759     Chief Complaint  Patient presents with  . Headache  . Emesis     (Consider location/radiation/quality/duration/timing/severity/associated sxs/prior Treatment) HPI  Patient p/w headache that started yesterday morning, improved throughout the day and returned this morning.  She woke up with the pain this morning.  Associated N/V, blurry vision.  Pain is located in the occiput, is throbbing, no radiation, 6/10 currently.  Exacerbated by light and sound.  States it "feels like I'm hungover but I didn't drink." Has taken tylenol without relief.  Denies fevers, head trauma, neck pain or stiffness, focal neurologic deficits. Denies "thunderclap" headache.  LMP ended two days ago, was on time.    Past Medical History  Diagnosis Date  . History of appendectomy 1999  . Abscess of axilla   . Lumbar back sprain   . Sciatica    Past Surgical History  Procedure Laterality Date  . Appendectomy     No family history on file. History  Substance Use Topics  . Smoking status: Never Smoker   . Smokeless tobacco: Not on file  . Alcohol Use: Yes     Comment: occasionally   OB History   Grav Para Term Preterm Abortions TAB SAB Ect Mult Living                 Review of Systems  All other systems reviewed and are negative.     Allergies  Aspirin; Food; Motrin; Latex; and Penicillins  Home Medications   Prior to Admission medications   Medication Sig Start Date End Date Taking? Authorizing Provider  Multiple Vitamin (MULTIVITAMIN WITH MINERALS) TABS tablet Take 1 tablet by mouth daily.    Historical Provider, MD  ondansetron (ZOFRAN) 4 MG tablet Take 1 tablet (4 mg total) by mouth every 6 (six) hours as needed for nausea or vomiting. 07/19/13   Dione Boozeavid Glick, MD  promethazine (PHENERGAN) 25 MG tablet Take 1 tablet (25 mg total) by mouth every 6 (six) hours as needed for  nausea or vomiting. 07/20/13   Dahlia ClientHannah Muthersbaugh, PA-C   BP 125/70  Pulse 86  Temp(Src) 97.8 F (36.6 C) (Oral)  Resp 18  SpO2 99%  LMP 01/14/2014 Physical Exam  Nursing note and vitals reviewed. Constitutional: She appears well-developed and well-nourished. No distress.  HENT:  Head: Normocephalic and atraumatic.  Neck: Normal range of motion. Neck supple.  Cardiovascular: Normal rate and regular rhythm.   Pulmonary/Chest: Effort normal and breath sounds normal. No respiratory distress. She has no wheezes. She has no rales.  Neurological: She is alert. She has normal strength. No cranial nerve deficit or sensory deficit. Coordination normal. GCS eye subscore is 4. GCS verbal subscore is 5. GCS motor subscore is 6.  CN II-XII intact, EOMs intact, no pronator drift, grip strengths equal bilaterally; strength 5/5 in all extremities, sensation intact in all extremities; finger to nose, heel to shin, rapid alternating movements normal; gait is normal.     Skin: She is not diaphoretic.  Psychiatric: She has a normal mood and affect. Her behavior is normal.    ED Course  Procedures (including critical care time) Labs Review Labs Reviewed - No data to display  Imaging Review No results found.   EKG Interpretation None      Filed Vitals:   01/17/14 0752  BP: 125/70  Pulse: 86  Temp: 97.8 F (  36.6 C)  Resp: 18     9:46 AM Patient reports complete relief of headache.  Repeat neurologic exam is normal.   MDM   Final diagnoses:  Acute nonintractable headache, unspecified headache type   Afebrile, nontoxic patient with what is likely migraine headache.  No red flags including head trauma, fevers, meningeal signs, focal neurologic deficits.  Partial migraine cocktail  (benadryl, reglan) given with complete relief.    D/C home with PCP follow up.  Discussed result, findings, treatment, and follow up  with patient.  Pt given return precautions.  Pt verbalizes understanding and  agrees with plan.      LangleyEmily Sloane Palmer, PA-C 01/17/14 [phone]

## 2014-01-17 NOTE — ED Notes (Addendum)
Pt states that she has had a headache since yesterday and started vomitting this morning. Pt states  "i feel like a hangover but i havent drank". Pt states that she is sensitive to light and sound

## 2014-01-18 NOTE — ED Provider Notes (Signed)
Medical screening examination/treatment/procedure(s) were performed by non-physician practitioner and as supervising physician I was immediately available for consultation/collaboration.   EKG Interpretation None       Robby Pirani, MD 01/18/14 0812 

## 2014-02-15 ENCOUNTER — Encounter (HOSPITAL_COMMUNITY): Payer: Self-pay | Admitting: *Deleted

## 2014-02-15 ENCOUNTER — Emergency Department (HOSPITAL_COMMUNITY): Payer: Medicaid Other

## 2014-02-15 ENCOUNTER — Emergency Department (HOSPITAL_COMMUNITY)
Admission: EM | Admit: 2014-02-15 | Discharge: 2014-02-15 | Disposition: A | Discharge status: Home / Self Care | Payer: Medicaid Other | Attending: Emergency Medicine | Admitting: Emergency Medicine

## 2014-02-15 DIAGNOSIS — M25512 Pain in left shoulder: Secondary | ICD-10-CM

## 2014-02-15 DIAGNOSIS — Z872 Personal history of diseases of the skin and subcutaneous tissue: Secondary | ICD-10-CM | POA: Insufficient documentation

## 2014-02-15 DIAGNOSIS — M5430 Sciatica, unspecified side: Secondary | ICD-10-CM | POA: Insufficient documentation

## 2014-02-15 DIAGNOSIS — Z79899 Other long term (current) drug therapy: Secondary | ICD-10-CM | POA: Diagnosis not present

## 2014-02-15 DIAGNOSIS — Z91040 Latex allergy status: Secondary | ICD-10-CM | POA: Diagnosis not present

## 2014-02-15 DIAGNOSIS — Z880 Allergy status to penicillin: Secondary | ICD-10-CM | POA: Diagnosis not present

## 2014-02-15 DIAGNOSIS — Z9089 Acquired absence of other organs: Secondary | ICD-10-CM | POA: Insufficient documentation

## 2014-02-15 MED ORDER — ACETAMINOPHEN 500 MG PO TABS
500.0000 mg | ORAL_TABLET | Freq: Once | ORAL | Status: AC
  Administered 2014-02-15: 500 mg via ORAL
  Filled 2014-02-15: qty 1

## 2014-02-15 MED ORDER — ACETAMINOPHEN 500 MG PO TABS: 500.0000 mg | ORAL_TABLET | Freq: Four times a day (QID) | ORAL | Status: AC | PRN

## 2014-02-15 NOTE — Discharge Instructions (Signed)
Shoulder Range of Motion Exercises °The shoulder is the most flexible joint in the human body. Because of this it is also the most unstable joint in the body. All ages can develop shoulder problems. Early treatment of problems is necessary for a good outcome. People react to shoulder pain by decreasing the movement of the joint. After a brief period of time, the shoulder can become "frozen". This is an almost complete loss of the ability to move the damaged shoulder. Following injuries your caregivers can give you instructions on exercises to keep your range of motion (ability to move your shoulder freely), or regain it if it has been lost.  °EXERCISES °EXERCISES TO MAINTAIN THE MOBILITY OF YOUR SHOULDER: °Codman's Exercise or Pendulum Exercise °· This exercise may be performed in a prone (face-down) lying position or standing while leaning on a chair with the opposite arm. Its purpose is to relax the muscles in your shoulder and slowly but surely increase the range of motion and to relieve pain. °· Lie on your stomach close to the side edge of the bed. Let your weak arm hang over the edge of the bed. Relax your shoulder, arm and hand. Let your shoulder blade relax and drop down. °· Slowly and gently swing your arm forward and back. Do not use your neck muscles; relax them. It might be easier to have someone else gently start swinging your arm. °· As pain decreases, increase your swing. To start, arm swing should begin at 15 degree angles. In time and as pain lessens, move to 30-45 degree angles. Start with swinging for about 15 seconds, and work towards swinging for 3 to 5 minutes. °· This exercise may also be performed in a standing/bent over position. °· Stand and hold onto a sturdy chair with your good arm. Bend forward at the waist and bend your knees slightly to help protect your back. Relax your weak arm, let it hang limp. Relax your shoulder blade and let it drop. °· Keep your shoulder relaxed and use body  motion to swing your arm in small circles. °· Stand up tall and relax. °· Repeat motion and change direction of circles. °· Start with swinging for about 30 seconds, and work towards swinging for 3 to 5 minutes. °STRETCHING EXERCISES: °· Lift your arm out in front of you with the elbow bent at 90 degrees. Using your other arm gently pull the elbow forward and across your body. °· Bend one arm behind you with the palm facing outward. Using the other arm, hold a towel or rope and reach this arm up above your head, then bend it at the elbow to move your wrist to behind your neck. Grab the free end of the towel with the hand behind your back. Gently pull the towel up with the hand behind your neck, gradually increasing the pull on the hand behind the small of your back. Then, gradually pull down with the hand behind the small of your back. This will pull the hand and arm behind your neck further. Both shoulders will have an increased range of motion with repetition of this exercise. °STRENGTHENING EXERCISES: °· Standing with your arm at your side and straight out from your shoulder with the elbow bent at 90 degrees, hold onto a small weight and slowly raise your hand so it points straight up in the air. Repeat this five times to begin with, and gradually increase to ten times. Do this four times per day. As you grow   stronger you can gradually increase the weight. °· Repeat the above exercise, only this time using an elastic band. Start with your hand up in the air and pull down until your hand is by your side. As you grow stronger, gradually increase the amount you pull by increasing the number or size of the elastic bands. Use the same amount of repetitions. °· Standing with your hand at your side and holding onto a weight, gradually lift the hand in front of you until it is over your head. Do the same also with the hand remaining at your side and lift the hand away from your body until it is again over your head.  Repeat this five times to begin with, and gradually increase to ten times. Do this four times per day. As you grow stronger you can gradually increase the weight. °Document Released: 12/13/2002 Document Revised: 03/21/2013 Document Reviewed: 03/16/2005 °[employer]® Patient Information ©2015 [employer]. This information is not intended to replace advice given to you by your health care provider. Make sure you discuss any questions you have with your health care provider. °Shoulder Pain °The shoulder is the joint that connects your arms to your body. The bones that form the shoulder joint include the upper arm bone (humerus), the shoulder blade (scapula), and the collarbone (clavicle). The top of the humerus is shaped like a ball and fits into a rather flat socket on the scapula (glenoid cavity). A combination of muscles and strong, fibrous tissues that connect muscles to bones (tendons) support your shoulder joint and hold the ball in the socket. Small, fluid-filled sacs (bursae) are located in different areas of the joint. They act as cushions between the bones and the overlying soft tissues and help reduce friction between the gliding tendons and the bone as you move your arm. Your shoulder joint allows a wide range of motion in your arm. This range of motion allows you to do things like scratch your back or throw a ball. However, this range of motion also makes your shoulder more prone to pain from overuse and injury. °Causes of shoulder pain can originate from both injury and overuse and usually can be grouped in the following four categories: °· Redness, swelling, and pain (inflammation) of the tendon (tendinitis) or the bursae (bursitis). °· Instability, such as a dislocation of the joint. °· Inflammation of the joint (arthritis). °· Broken bone (fracture). °HOME CARE INSTRUCTIONS  °· Apply ice to the sore area. °¨ Put ice in a plastic bag. °¨ Place a towel between your skin and the bag. °¨ Leave the ice on  for 15-20 minutes, 3-4 times per day for the first 2 days, or as directed by your health care provider. °· Stop using cold packs if they do not help with the pain. °· If you have a shoulder sling or immobilizer, wear it as long as your caregiver instructs. Only remove it to shower or bathe. Move your arm as little as possible, but keep your hand moving to prevent swelling. °· Squeeze a soft ball or foam pad as much as possible to help prevent swelling. °· Only take over-the-counter or prescription medicines for pain, discomfort, or fever as directed by your caregiver. °SEEK MEDICAL CARE IF:  °· Your shoulder pain increases, or new pain develops in your arm, hand, or fingers. °· Your hand or fingers become cold and numb. °· Your pain is not relieved with medicines. °SEEK IMMEDIATE MEDICAL CARE IF:  °· Your arm, hand, or fingers   are numb or tingling. °· Your arm, hand, or fingers are significantly swollen or turn white or blue. °MAKE SURE YOU:  °· Understand these instructions. °· Will watch your condition. °· Will get help right away if you are not doing well or get worse. °Document Released: 12/24/2004 Document Revised: 07/31/2013 Document Reviewed: 02/28/2011 °[employer]® Patient Information ©2015 [employer]. This information is not intended to replace advice given to you by your health care provider. Make sure you discuss any questions you have with your health care provider. ° °

## 2014-02-15 NOTE — ED Provider Notes (Signed)
CSN: [id number]     Arrival date & time 02/15/14  0806 History   First MD Initiated Contact with Patient 02/15/14 [phone]     Chief Complaint  Patient presents with  . Shoulder Pain   Doreen BeamJayna Mosco is a 33 y.o. female who presents to the ED c/o left shoulder pain for 2 days. She denies trauma to her shoulder. Her pain is 6/10 and worse with movement. She has not attempted any treatments today. She reports that yesterday after using her left shoulder excessively she noticed some left hand swelling. She denies current hand swelling. She denies swelling to her shoulder. She has full range of motion of her shoulder. She denies previous injury to her shoulder. She denies numbness, tingling, chest pain, shortness of breath, tingling, weakness, abdominal pain, nausea. She denies history of heart problems. She denies family history of CVD, PE or DVT. She denies smoking, HTN, cocaine use or diabetes.    (Consider location/radiation/quality/duration/timing/severity/associated sxs/prior Treatment) HPI  Past Medical History  Diagnosis Date  . History of appendectomy 1999  . Abscess of axilla   . Lumbar back sprain   . Sciatica    Past Surgical History  Procedure Laterality Date  . Appendectomy     No family history on file. History  Substance Use Topics  . Smoking status: Never Smoker   . Smokeless tobacco: Not on file  . Alcohol Use: Yes     Comment: occasionally   OB History    No data available     Review of Systems  Constitutional: Negative for fever, chills and fatigue.  HENT: Negative for congestion, ear pain, rhinorrhea, sinus pressure, sore throat and trouble swallowing.   Eyes: Negative for pain and visual disturbance.  Respiratory: Negative for cough, shortness of breath and wheezing.   Cardiovascular: Negative for chest pain, palpitations and leg swelling.  Gastrointestinal: Negative for nausea, vomiting, abdominal pain and diarrhea.  Genitourinary: Negative for dysuria.   Musculoskeletal: Negative for back pain, joint swelling, neck pain and neck stiffness.       Left shoulder pain  Skin: Negative for pallor, rash and wound.  Neurological: Negative for dizziness, weakness, numbness and headaches.  All other systems reviewed and are negative.     Allergies  Aspirin; Food; Motrin; Latex; and Penicillins  Home Medications   Prior to Admission medications   Medication Sig Start Date End Date Taking? Authorizing Provider  Multiple Vitamin (MULTIVITAMIN WITH MINERALS) TABS tablet Take 1 tablet by mouth daily.   Yes Historical Provider, MD  acetaminophen (TYLENOL) 500 MG tablet Take 1 tablet (500 mg total) by mouth every 6 (six) hours as needed. 02/15/14   Einar GipWilliam Duncan Thi Sisemore, PA-C   BP 139/73 mmHg  Pulse 70  Temp(Src) 97.7 F (36.5 C) (Oral)  Resp 18  Ht 5\' 3"  (1.6 m)  Wt 230 lb (104.327 kg)  BMI 40.75 kg/m2  SpO2 98%  LMP 01/14/2014 Physical Exam  Constitutional: She appears well-developed and well-nourished. No distress.  HENT:  Head: Normocephalic and atraumatic.  Mouth/Throat: Oropharynx is clear and moist.  Eyes: Conjunctivae are normal. Pupils are equal, round, and reactive to light. Right eye exhibits no discharge. Left eye exhibits no discharge.  Neck: Neck supple.  Cardiovascular: Normal rate, regular rhythm, normal heart sounds and intact distal pulses.  Exam reveals no gallop and no friction rub.   No murmur heard. Bilateral radial pulses 2+. Capillary refill less than 2 seconds in her left hand.   Pulmonary/Chest: Effort normal and breath  sounds normal. No respiratory distress. She has no wheezes. She has no rales.  Abdominal: Soft. There is no tenderness.  Musculoskeletal: She exhibits no edema.  Tenderness over her left acromioclavicular joint. No deformity, crepitus or ecchymosis noted. No edema noted to her left hand or upper extremity. Patient has full ROM of her left shoulder. Equal grip strength bilaterally. 5/5 strength in  her bilateral upper extremities.   Lymphadenopathy:    She has no cervical adenopathy.  Neurological: She is alert. Coordination normal.  Sensation intact in her bilateral upper extremities.   Skin: Skin is warm and dry. No rash noted. She is not diaphoretic. No erythema. No pallor.  Psychiatric: She has a normal mood and affect. Her behavior is normal.  Nursing note and vitals reviewed.   ED Course  Procedures (including critical care time) Labs Review Labs Reviewed - No data to display  Imaging Review Dg Shoulder Left  02/15/2014   CLINICAL DATA:  Acute onset left shoulder pain.  No known injury.  EXAM: LEFT SHOULDER - 2+ VIEW  COMPARISON:  None.  FINDINGS: There is no evidence of fracture or dislocation. There is no evidence of arthropathy or other focal bone abnormality. Soft tissues are unremarkable.  IMPRESSION: Negative.   Electronically Signed   By: Myles RosenthalJohn  Stahl M.D.   On: 02/15/2014 08:58     EKG Interpretation None      Filed Vitals:   02/15/14 0814  BP: 139/73  Pulse: 70  Temp: 97.7 F (36.5 C)  TempSrc: Oral  Resp: 18  Height: 5\' 3"  (1.6 m)  Weight: 230 lb (104.327 kg)  SpO2: 98%     MDM   Meds given in ED:  Medications  acetaminophen (TYLENOL) tablet 500 mg (500 mg Oral Given 02/15/14 [id number])    Discharge Medication List as of 02/15/2014  9:11 AM      Final diagnoses:  Left shoulder pain   Doreen BeamJayna Desrochers is a 33 y.o. female who presents to the ED c/o left shoulder pain for 2 days. She denies trauma to her shoulder. Her x-ray is negative. She has full ROM of her left shoulder. There is no edema or deformity. She is neurovascularly intact. Patient placed in shoulder sling and advised to follow-up with ortho. Patient did not wish for pain medicine. Advised to follow up with her PCP for continued pain. Advised to return to the ED with new or worsening symptoms or new concerns. Patient verbalized understanding and agreement with plan.   Patient discussed with  Wynetta EmeryNicole Pisciotta  PA who agrees with assessment and plan.       Lawana ChambersWilliam Duncan Hanz [place], PA-C 02/15/14 1422  Tilden FossaElizabeth Rees, MD 02/15/14 [phone]

## 2014-02-15 NOTE — ED Notes (Signed)
Pt c/o L shoulder pain - describes as tension.  Pt states L hand feels swollen.  States movement, holding and lifting increases pain.

## 2015-03-24 IMAGING — CR DG CHEST 2V
2 series · 2 of 2 positions shown · non-contrast
Comparison: CHEST x-ray 12/08/2007.

CLINICAL DATA: Chest pain.

EXAM:
CHEST  2 VIEW

[w chest pa]
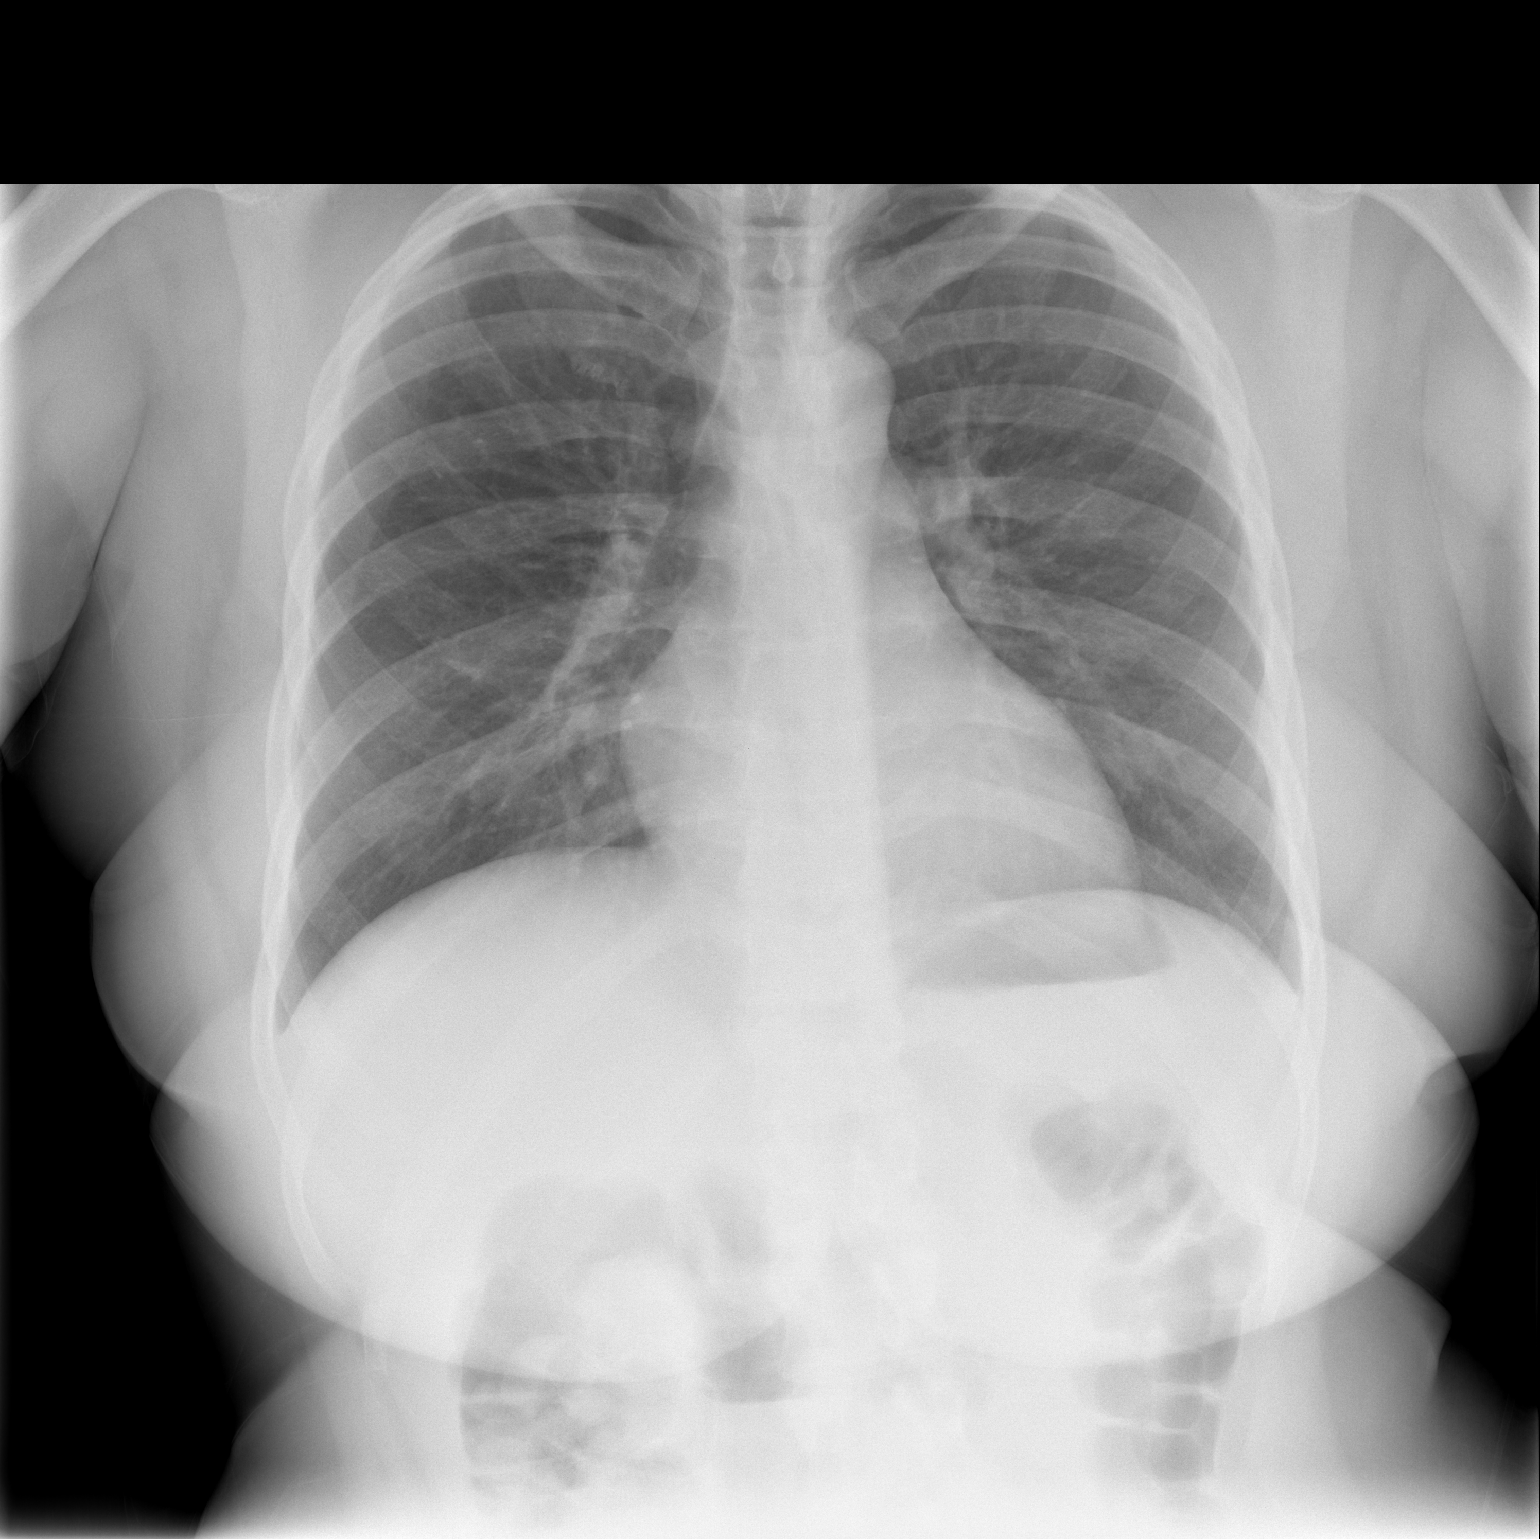

[w chest lat]
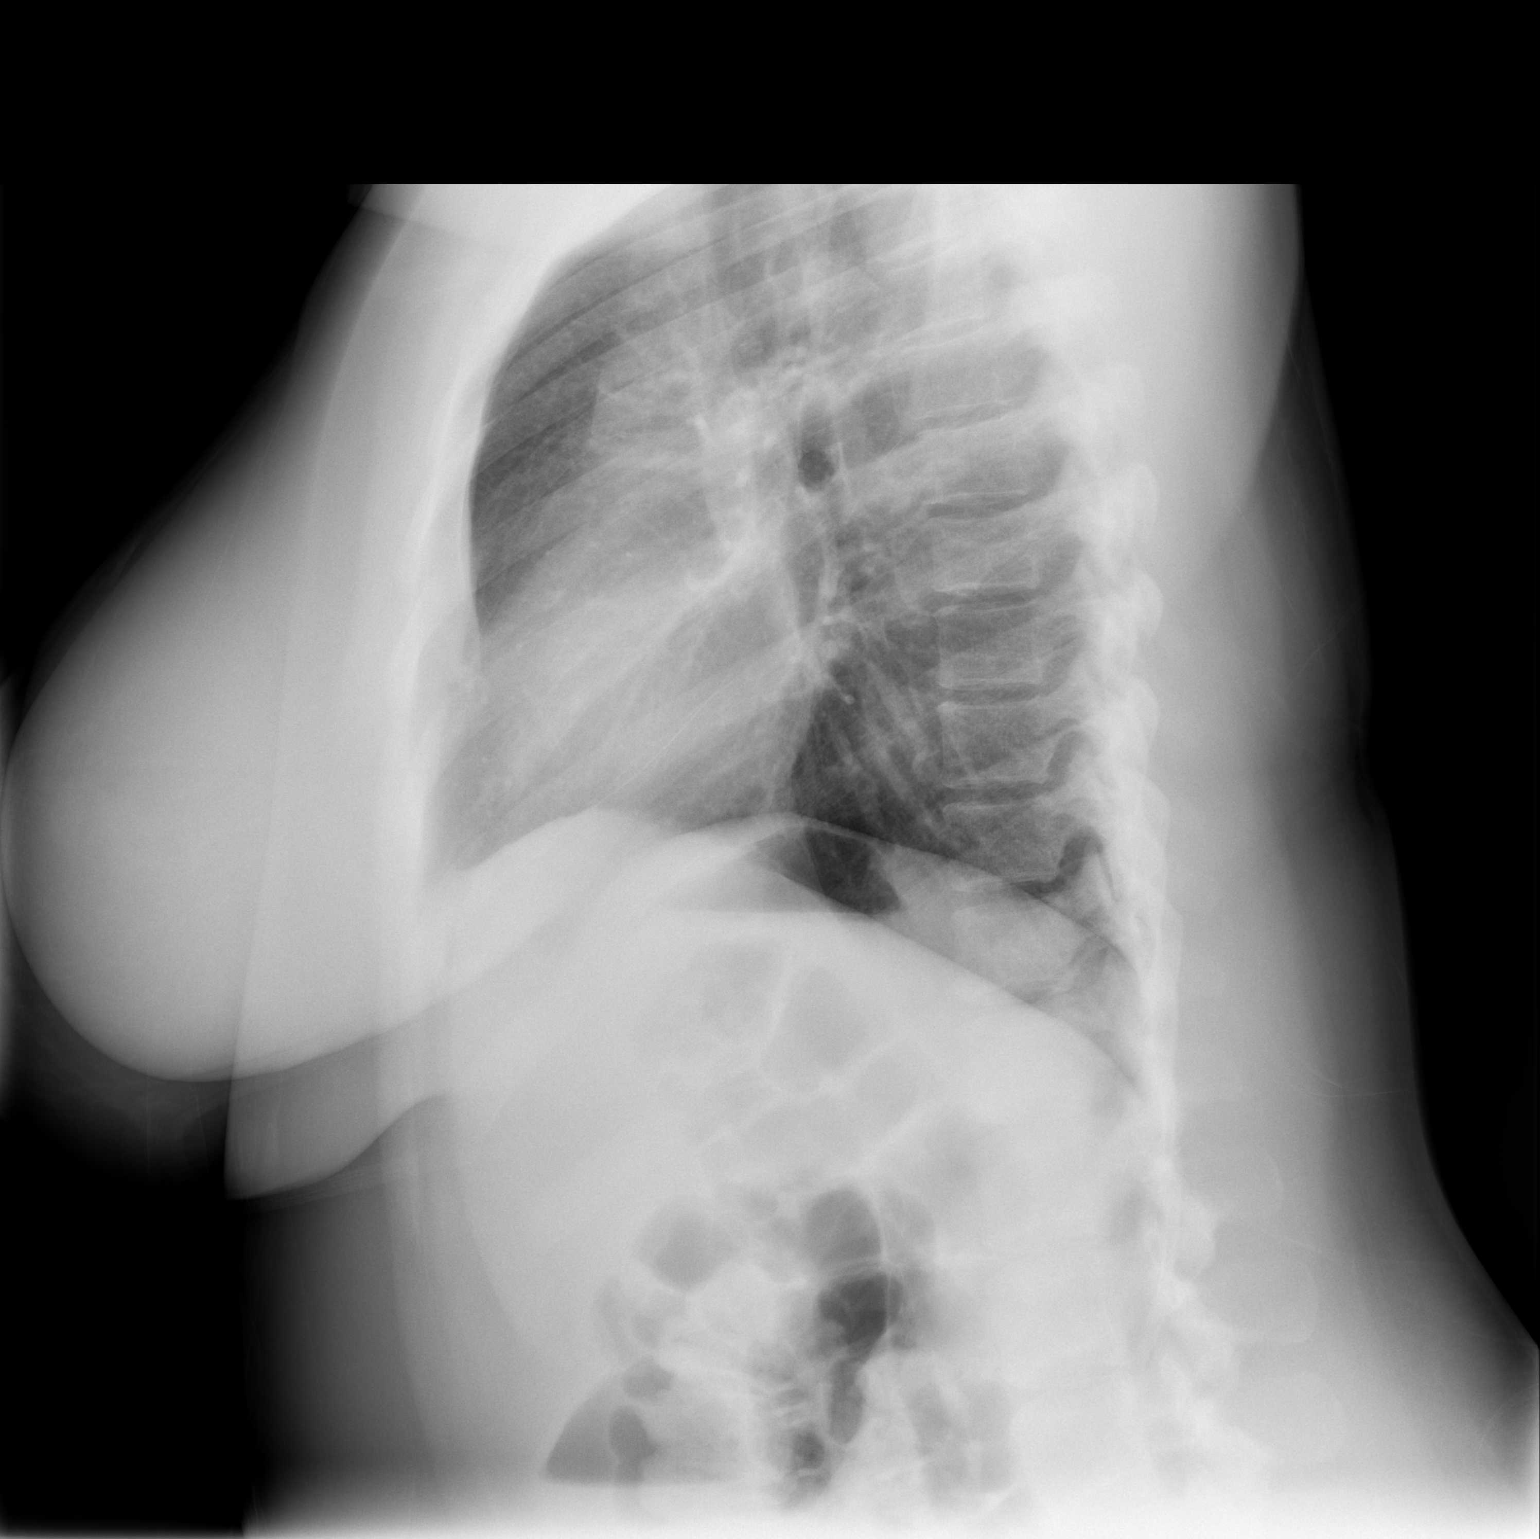

[2 of 2 positions shown; findings below may reference images not displayed]

FINDINGS: Lung volumes are normal. No consolidative airspace disease. No
pleural effusions. No pneumothorax. No pulmonary nodule or mass
noted. Pulmonary vasculature and the cardiomediastinal silhouette
are within normal limits.
IMPRESSION: 1.  No radiographic evidence of acute cardiopulmonary disease.

## 2016-07-20 ENCOUNTER — Ambulatory Visit (INDEPENDENT_AMBULATORY_CARE_PROVIDER_SITE_OTHER): Payer: BLUE CROSS/BLUE SHIELD

## 2016-07-20 ENCOUNTER — Encounter: Payer: Self-pay | Admitting: Physician Assistant

## 2016-07-20 ENCOUNTER — Ambulatory Visit (INDEPENDENT_AMBULATORY_CARE_PROVIDER_SITE_OTHER): Payer: BLUE CROSS/BLUE SHIELD | Admitting: Physician Assistant

## 2016-07-20 VITALS — BP 135/92 | HR 65 | Temp 97.9°F | Resp 18 | Ht 64.37 in | Wt 246.6 lb

## 2016-07-20 DIAGNOSIS — S93529A Sprain of metatarsophalangeal joint of unspecified toe(s), initial encounter: Secondary | ICD-10-CM

## 2016-07-20 DIAGNOSIS — M79672 Pain in left foot: Secondary | ICD-10-CM

## 2016-07-20 NOTE — Patient Instructions (Addendum)
Please remain in walking boot to keep the MTP joint immobilized. Wear the walking boot up to a week if needed. Afterwards, these injuries are managed with a taping regimen also called "buddy tape". Buddy tape for 2-3 weeks.  See below for information.  Orthotics, such as a thin, graphite shoe insert with a rigid forefoot component, can reduce stress on the plantar plate and provide stability.  Tylenol for pain.   Thank you for coming in today. I hope you feel we met your needs.  Feel free to call [facility] if you have any questions or further requests.  Please consider signing up for MyChart if you do not already have it, as this is a great way to communicate with me.  Best,  Whitney McVey, PA-C   Turf Toe Turf toe is an injury that affects the joint at the base of the big toe. It occurs when the toe is bent upward by force and extended beyond its normal limits (hyperextension). The joint of the big toe is surrounded by tissues (ligaments and tendons) that help to keep it in place. If any of these tissues are damaged, turf toe may result. Turf toe is a common sports injury. It can be mild if the tissue was stretched. It may be more severe if the tissue was partially or completely torn. Early treatment usually results in good recovery. In some cases, a person may continue to have some pain, joint stiffness, and reduced ability to push off from the affected foot. What are the causes? This injury is caused by extreme upward bending of the big toe joint. It can occur when:  Your toe is pressed flat to the ground and your heel is raised while you push off forcefully with the front of your foot. For example, this could happen when you begin a sprint.  You push off on the ball of your foot repeatedly while running or jumping, especially on hard surfaces such as a basketball court.  You jam your toe from a force pushing into the toe. What increases the risk? This injury is more likely to occur in  people:  Who wear flexible shoes that do not offer good support while running or jumping.  Who participate in activities or sports that involve running and jumping on turf or hard surfaces, such as:  Soccer.  Football.  Basketball.  Volleyball.  Gymnastics.  Dancing.  Wrestling. What are the signs or symptoms? Symptoms of this condition include:  Pain at the base of the toe.  Swelling at the base of the toe.  Stiffness.  Limited movement of the toe.  Bruising. If turf toe is the result of a direct injury, symptoms may appear suddenly. If the condition is due to repetitive movements, such as running and jumping, the symptoms may develop gradually and worsen over time. How is this diagnosed? This condition may be diagnosed based on your symptoms, your medical history, and a physical exam of your foot. During the exam, the health care provider will check the range of motion of your toe by moving it up and down and from side to side. You may also have tests to confirm the diagnosis, such as:  X-rays to rule out bone problems, such as a fracture, a chip, or bones that are out of alignment.  MRI to view the soft tissue and cartilage in your toe. This will help to determine how severe your injury is. How is this treated? Treatment for this condition depends on the  severity of the injury. Treatment may include:  Rest, ice, compression, and elevation. This is often called the RICE strategy. It may be recommended if the injury is mild. Your health care provider may also restrict movement of your toe by taping it to the smaller toes.  Wearing a walking boot or a cast. For more serious turf toe, you may need to wear a walking boot for about one week. This will keep your toe from moving (immobilization). If you have severe turf toe, you may need to wear a cast or a walking boot for several weeks.  Over-the-counter medicines to relieve pain.  Physical therapy. Stretching and  strengthening exercises can help to reduce or prevent joint stiffness in your toe.  Surgery. In rare cases, surgery may be needed for a severe injury if pain does not go away. Follow these instructions at home: Managing pain, stiffness, and swelling   If directed, apply ice to the injured area:  Put ice in a plastic bag.  Place a towel between your skin and the bag.  Leave the ice on for 20 minutes, 2-3 times per day.  Move your toes often to avoid stiffness and to lessen swelling.  Wear an elastic compression bandage to help prevent or lessen swelling.  Raise (elevate) your foot above the level of your heart while you are sitting or lying down.  If you have a walking boot, wear it as told by your health care provider.  Do not use the injured foot to support (bear) your body weight until your health care provider says that you can. Use crutches as told by your health care provider. Medicines   Take over-the-counter and prescription medicines only as told by your health care provider.  Do not drive or operate heavy machinery while taking prescription pain medicine. If you have a cast:   Do not put pressure on any part of the cast until it is fully hardened. This may take several hours.  Do not stick anything inside the cast to scratch your skin. Doing that increases your risk of infection.  Check the skin around the cast every day. Report any concerns to your health care provider. You may put lotion on dry skin around the edges of the cast. Do not apply lotion to the skin underneath the cast.  Do not let your cast get wet if it is not waterproof.  Keep the cast clean. General instructions   If your cast is not waterproof, cover it with a watertight plastic bag when you take a bath or a shower.  Return to your normal activities as told by your health care provider. Ask your health care provider what activities are safe for you.  Switch to less-flexible, more supportive  footwear as told by your health care provider. Rigid shoe inserts (orthotics) can also reduce stress on your toes and improve stability.  Keep all follow-up visits as told by your health care provider. This is important. Contact a health care provider if:  You have new bruising or swelling in your toe.  The pain in your toe gets worse.  Your pain medicine is not helping. Get help right away if:  Your cast or walking boot becomes loose or damaged.  Your pain becomes severe.  Your toe becomes numb or changes color.  Your toe joint feels unstable or is unable to bear any weight. This information is not intended to replace advice given to you by your health care provider. Make sure you discuss  any questions you have with your health care provider. Document Released: 09/05/2001 Document Revised: 11/17/2015 Document Reviewed: 09/26/2014 Elsevier Interactive Patient Education  2017 Hand Ask your health care provider which exercises are safe for you. Do exercises exactly as told by your health care provider and adjust them as directed. It is normal to feel mild stretching, pulling, tightness, or discomfort as you do these exercises, but you should stop right away if you feel sudden pain or your pain gets worse. Do not begin these exercises until told by your health care provider. Stretching and range of motion exercises These exercises warm up your muscles and joints and improve the movement and flexibility of your foot. These exercises also help to relieve pain. Exercise A: Toe flexion   1. Sit with your left / rightleg crossed over your opposite knee. 2. Gently pull your big toe toward the bottom of your foot. You should feel a stretch on the top of your toe and foot. 3. Hold this position for __________ seconds. 4. Release your toe and return to the starting position. Repeat __________ times. Complete this exercise __________ times a day. Exercise B: Gastroc,  standing   1. Stand with your hands against a wall. 2. Extend your left / right leg behind you, and bend your front knee slightly. Your heels should be on the floor. 3. Keeping your heels on the floor, keep your back knee straight and shift your weight toward the wall. You should feel a gentle stretch in the back of your lower leg (calf). 4. Hold this position for __________ seconds. 5. Return to the starting position. Repeat __________ times. Complete this exercise __________ times a day. Exercise C: Soleus, standing  1. Stand with your hands against a wall. 2. Extend your left / right leg behind you, and bend your front knee slightly. Your heels should be on the floor. 3. Keeping your heels on the floor, bend your back knee and shift your weight slightly over your back leg. You should feel a gentle stretch deep in your calf. 4. Hold this position for __________ seconds. 5. Return to the starting position. Repeat __________ times. Complete this exercise __________ times a day. Strengthening exercises These exercises build strength and endurance in your foot. Endurance is the ability to use your muscles for a long time, even after they get tired. Exercise D: Towel curls (  toe flexors) 1. Sit in a chair on a non-carpeted surface, and put your feet on the floor. 2. Place a towel in front of your feet. If told by your health care provider, add __________ to the end of the towel. 3. Keeping your heel on the floor, put your left / right foot on the towel. 4. Pull the towel toward your heel by grabbing the towel with your left / right toes and curling them under. Keep your heel on the floor while you do this. 5. Let your toes relax. 6. Grab the towel again. Keep going until the towel is completely underneath your foot. Repeat __________ times. Complete this exercise __________ times a day. Exercise E: Arch lifts ( foot intrinsics) 1. Sit in a chair with your feet flat on the floor. 2. Keeping  your big toe and your heel on the floor, lift only your arch, which is on the inner edge of your left / right foot. Do not move your knees or scrunch your toes. This is a small movement. 3. Hold this position for  __________ seconds. 4. Slowly return to the starting position. Repeat __________ times. Complete this exercise __________ times a day. This information is not intended to replace advice given to you by your health care provider. Make sure you discuss any questions you have with your health care provider. Document Released: 03/16/2005 Document Revised: 11/21/2015 Document Reviewed: 01/28/2015 Elsevier Interactive Patient Education  2017 [employer].

## 2016-07-20 NOTE — Progress Notes (Signed)
Joanne Hanna  MRN: [id number] DOB: [birth year]  PCP: No PCP Per Patient  Subjective:  Pt is a 36 year old female who presents to clinic for foot pain.  She was playing baseball 3 days ago when her knee locked up and her foot bent up towards her shin. She felt a "pop" on the inside of her left foot. Pain is located near the big joint of her big toe.    Walking makes it worse - she is walking on the outside of her foot to lessen pressure on the inside. Pain is present when she is not moving as well. 6/10 pain. She elevated and iced her foot after the injury. Pain does not keep her up- no pain with weight of blankets on her toe. Denies bruising, increased warmth to joint/foot, fever, chills, n/t.   She has tried tylenol.  No prior foot injury.  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 She wears sturdy boots at work.   Review of Systems  Musculoskeletal: Positive for arthralgias and gait problem.  Neurological: Negative for weakness and numbness.  Psychiatric/Behavioral: Negative for sleep disturbance.    There are no active problems to display for this patient.   Current Outpatient Prescriptions on File Prior to Visit  Medication Sig Dispense Refill  . acetaminophen (TYLENOL) 500 MG tablet Take 1 tablet (500 mg total) by mouth every 6 (six) hours as needed. 30 tablet 0  . Multiple Vitamin (MULTIVITAMIN WITH MINERALS) TABS tablet Take 1 tablet by mouth daily.     No current facility-administered medications on file prior to visit.     Allergies  Allergen Reactions  . Aspirin Anaphylaxis  . Food Shortness Of Breath    Water chestnuts  . Motrin [Ibuprofen] Hives  . Latex Hives and Rash  . Penicillins Rash     Objective:  BP (!) 135/92   Pulse 65   Temp 97.9 F (36.6 C) (Oral)   Resp 18   Ht 5' 4.37" (1.635 m)   Wt 246 lb 9.6 oz (111.9 kg)   LMP 07/13/2016 (Approximate)   SpO2 99%   BMI 41.84 kg/m   Physical Exam  Constitutional: She  is oriented to person, place, and time and well-developed, well-nourished, and in no distress. No distress.  Cardiovascular: Intact distal pulses and normal pulses.   Musculoskeletal:       Left foot: There is decreased range of motion and bony tenderness (MTP joint). There is no swelling, no crepitus and no deformity.  No erythema or ecchymosis. Reduced ROM with dorsiflexion. Pain with dorsiflexion and plantar flexion against resistance. Pt compensates during ambulation with bearing weight mostly on lateral foot.   Neurological: She is alert and oriented to person, place, and time. She has normal sensation. GCS score is 15.  Skin: Skin is warm and dry. No bruising, no ecchymosis and no petechiae noted. No erythema.   Dg Foot Complete Left  Result Date: 07/20/2016 CLINICAL DATA:  Dorsiflexion injury with pain EXAM: LEFT FOOT - COMPLETE 3+ VIEW COMPARISON:  None. FINDINGS: Frontal, oblique, and lateral views were obtained. There is no fracture or dislocation. Joint spaces appear normal. No erosive change. There is a minimal inferior calcaneal spur. IMPRESSION: Minimal inferior calcaneal spur. No fracture or dislocation. No appreciable arthropathy. Electronically Signed   By: Bretta Bang III M.D.   On: 07/20/2016 09:42    Assessment and Plan :  1. Turf toe, initial encounter 2.  Left foot pain - DG Foot Complete Left; Future - X-ray is negative. Suspect turf toe. Buddy tape and short walking boot applied. She is to remain in walking boot for 1 week. Supportive care discussed: buddy tape x 2-3 weeks, rest, ice, elevation. Exercised printed out for pt. Encouraged proper orthotics following walking boot. RTC in 2-3 weeks if no improvement.   Marco Collie, PA-C  Primary [facility] at [facility] 07/20/2016 8:52 AM

## 2016-12-06 ENCOUNTER — Encounter (HOSPITAL_COMMUNITY): Payer: Self-pay | Admitting: Emergency Medicine

## 2016-12-06 DIAGNOSIS — R112 Nausea with vomiting, unspecified: Secondary | ICD-10-CM | POA: Diagnosis not present

## 2016-12-06 DIAGNOSIS — Z91040 Latex allergy status: Secondary | ICD-10-CM | POA: Diagnosis not present

## 2016-12-06 DIAGNOSIS — R197 Diarrhea, unspecified: Secondary | ICD-10-CM | POA: Insufficient documentation

## 2016-12-06 LAB — CBC
HCT: 42 % (ref 36.0–46.0)
Hemoglobin: 14.3 g/dL (ref 12.0–15.0)
MCH: 27.3 pg (ref 26.0–34.0)
MCHC: 34 g/dL (ref 30.0–36.0)
MCV: 80.3 fL (ref 78.0–100.0)
PLATELETS: 300 10*3/uL (ref 150–400)
RBC: 5.23 MIL/uL — ABNORMAL HIGH (ref 3.87–5.11)
RDW: 13.2 % (ref 11.5–15.5)
WBC: 8.7 10*3/uL (ref 4.0–10.5)

## 2016-12-06 LAB — I-STAT BETA HCG BLOOD, ED (MC, WL, AP ONLY): I-stat hCG, quantitative: <5 m[IU]/mL (ref <5)

## 2016-12-06 MED ORDER — ONDANSETRON 4 MG PO TBDP
4.0000 mg | ORAL_TABLET | Freq: Once | ORAL | Status: AC | PRN
  Administered 2016-12-06: 4 mg via ORAL
  Filled 2016-12-06: qty 1

## 2016-12-06 NOTE — ED Triage Notes (Signed)
Pt comes in with complaints of nausea and diarrhea that began this morning around 0900. Reports her son had a GI bug recently and she may have gotten it from him. Ambulatory.  A&O x4.

## 2016-12-07 ENCOUNTER — Emergency Department (HOSPITAL_COMMUNITY)
Admission: EM | Admit: 2016-12-07 | Discharge: 2016-12-07 | Disposition: A | Discharge status: Home / Self Care | Payer: BLUE CROSS/BLUE SHIELD | Attending: Emergency Medicine | Admitting: Emergency Medicine

## 2016-12-07 DIAGNOSIS — K529 Noninfective gastroenteritis and colitis, unspecified: Secondary | ICD-10-CM

## 2016-12-07 LAB — COMPREHENSIVE METABOLIC PANEL
ALK PHOS: 65 U/L (ref 38–126)
ALT: 15 U/L (ref 14–54)
AST: 19 U/L (ref 15–41)
Albumin: 4.6 g/dL (ref 3.5–5.0)
Anion gap: 8 (ref 5–15)
BUN: 7 mg/dL (ref 6–20)
CALCIUM: 10.3 mg/dL (ref 8.9–10.3)
CO2: 22 mmol/L (ref 22–32)
Chloride: 105 mmol/L (ref 101–111)
Creatinine, Ser: 0.82 mg/dL (ref 0.44–1.00)
GFR calc Af Amer: >60 mL/min (ref >60)
Glucose, Bld: 98 mg/dL (ref 65–99)
Potassium: 3.6 mmol/L (ref 3.5–5.1)
Sodium: 135 mmol/L (ref 135–145)
Total Bilirubin: 0.9 mg/dL (ref 0.3–1.2)
Total Protein: 8.9 g/dL — ABNORMAL HIGH (ref 6.5–8.1)

## 2016-12-07 LAB — LIPASE, BLOOD: Lipase: 22 U/L (ref 11–51)

## 2016-12-07 MED ORDER — ONDANSETRON 4 MG PO TBDP
4.0000 mg | ORAL_TABLET | Freq: Three times a day (TID) | ORAL | 0 refills | Status: AC | PRN
Start: 2016-12-07 — End: ?

## 2016-12-07 MED ORDER — LOPERAMIDE HCL 2 MG PO CAPS
4.0000 mg | ORAL_CAPSULE | Freq: Once | ORAL | Status: AC
  Administered 2016-12-07: 4 mg via ORAL
  Filled 2016-12-07: qty 2

## 2016-12-07 MED ORDER — LOPERAMIDE HCL 2 MG PO CAPS: 2.0000 mg | ORAL_CAPSULE | Freq: Four times a day (QID) | ORAL | 0 refills | Status: AC | PRN

## 2016-12-07 MED ORDER — ONDANSETRON 8 MG PO TBDP
8.0000 mg | ORAL_TABLET | Freq: Once | ORAL | Status: AC
  Administered 2016-12-07: 8 mg via ORAL
  Filled 2016-12-07: qty 1

## 2016-12-07 NOTE — ED Notes (Signed)
Patient given gingerale for fluid/PO challenge.

## 2016-12-07 NOTE — ED Notes (Signed)
Explained to patient that a urine specimen needed to be collected and patient explained to this nurse tech that all she could do was give a stool specimen. This nurse tech explained when she got the urge to give urine specimen that she needed to let nursing staff know.

## 2016-12-07 NOTE — ED Provider Notes (Signed)
[facility]-EMERGENCY DEPT Provider Note   CSN: [id number] Arrival date & time: 12/06/16  2206     History   Chief Complaint Chief Complaint  Patient presents with  . Emesis  . Diarrhea    HPI Joanne Hanna is a 36 y.o. female.  Patient is here for treatment of diarrhea that started earlier today. Stools have been watery, non-bloody. No fever. She reports abdominal cramping prior to bowel movements but no significant or constant abdominal pain. She had nausea all day without vomiting until this evening, also non-bloody. She reports her son has had similar symptoms.      Emesis   Associated symptoms include abdominal pain and diarrhea. Pertinent negatives include no chills and no fever.  Diarrhea   Associated symptoms include abdominal pain and vomiting. Pertinent negatives include no chills.    Past Medical History:  Diagnosis Date  . Abscess of axilla   . Allergy   . History of appendectomy 1999  . Lumbar back sprain   . PTSD (post-traumatic stress disorder)    [employer]   . Sciatica     There are no active problems to display for this patient.   Past Surgical History:  Procedure Laterality Date  . APPENDECTOMY      OB History    No data available       Home Medications    Prior to Admission medications   Medication Sig Start Date End Date Taking? Authorizing Provider  acetaminophen (TYLENOL) 500 MG tablet Take 1 tablet (500 mg total) by mouth every 6 (six) hours as needed. Patient not taking: Reported on 12/07/2016 02/15/14   Everlene Farrieransie, William, PA-C    Family History Family History  Problem Relation Age of Onset  . Diabetes Father   . Heart disease Father   . Hyperlipidemia Father   . Hypertension Father   . Cancer Maternal Grandmother   . Cancer Maternal Grandfather   . Diabetes Paternal Grandmother   . Hypertension Paternal Grandmother   . Stroke Paternal Grandmother   . Heart disease Paternal Grandfather   . Hyperlipidemia Paternal Grandfather   .  Gout Paternal Grandfather     Social History Social History  Substance Use Topics  . Smoking status: Never Smoker  . Smokeless tobacco: Never Used  . Alcohol use Yes     Comment: occasionally     Allergies   Aspirin; Food; Motrin [ibuprofen]; Latex; and Penicillins   Review of Systems Review of Systems  Constitutional: Negative for chills and fever.  Respiratory: Negative.   Cardiovascular: Negative.   Gastrointestinal: Positive for abdominal pain, diarrhea and vomiting. Negative for blood in stool.  Genitourinary: Negative.   Musculoskeletal: Negative.   Skin: Negative.   Neurological: Negative.      Physical Exam Updated Vital Signs BP (!) 148/92 (BP Location: Right Arm)   Pulse 73   Temp 98.3 F (36.8 C) (Oral)   Resp 14   Ht 5\' 3"  (1.6 m)   Wt 106.6 kg (235 lb)   SpO2 96%   BMI 41.63 kg/m   Physical Exam  Constitutional: She is oriented to person, place, and time. She appears well-developed and well-nourished.  HENT:  Head: Normocephalic.  Neck: Normal range of motion. Neck supple.  Cardiovascular: Normal rate and regular rhythm.   Pulmonary/Chest: Effort normal and breath sounds normal.  Abdominal: Soft. Bowel sounds are normal. There is no tenderness. There is no rebound and no guarding.  Musculoskeletal: Normal range of motion.  Neurological: She is alert and  oriented to person, place, and time.  Skin: Skin is warm and dry. No rash noted.  Psychiatric: She has a normal mood and affect.     ED Treatments / Results  Labs (all labs ordered are listed, but only abnormal results are displayed) Labs Reviewed  COMPREHENSIVE METABOLIC PANEL - Abnormal; Notable for the following:       Result Value   Total Protein 8.9 (*)    All other components within normal limits  CBC - Abnormal; Notable for the following:    RBC 5.23 (*)    All other components within normal limits  LIPASE, BLOOD  URINALYSIS, ROUTINE W REFLEX MICROSCOPIC  I-STAT BETA HCG BLOOD,  ED ([facility], [facility], [facility] ONLY)    EKG  EKG Interpretation None       Radiology No results found.  Procedures Procedures (including critical care time)  Medications Ordered in ED Medications  loperamide (IMODIUM) capsule 4 mg (not administered)  ondansetron (ZOFRAN-ODT) disintegrating tablet 8 mg (not administered)  ondansetron (ZOFRAN-ODT) disintegrating tablet 4 mg (4 mg Oral Given 12/06/16 2248)     Initial Impression / Assessment and Plan / ED Course  I have reviewed the triage vital signs and the nursing notes.  Pertinent labs & imaging results that were available during my care of the patient were reviewed by me and considered in my medical decision making (see chart for details).     Patient with copious diarrhea x 1 day, with late onset vomiting. No fever. Family member with similar symptoms.  She is well appearing. Does not appear dehydrated. VSS, no tachycardia. Will treat symptoms and PO challenge. Will reassess but anticipate discharge home.  Final Clinical Impressions(s) / ED Diagnoses   Final diagnoses:  None   1. Diarrhea 2. Nausea and vomiting  New Prescriptions New Prescriptions   No medications on file     Elpidio Anis, Cordelia Poche 12/07/16 0129    Shon Baton, MD 12/08/16 2253

## 2018-09-10 IMAGING — DX DG FOOT COMPLETE 3+V*L*
3 series · 3 of 3 positions shown · non-contrast
Comparison: None.

CLINICAL DATA: Dorsiflexion injury with pain

EXAM:
LEFT FOOT - COMPLETE 3+ VIEW

[foot ap]
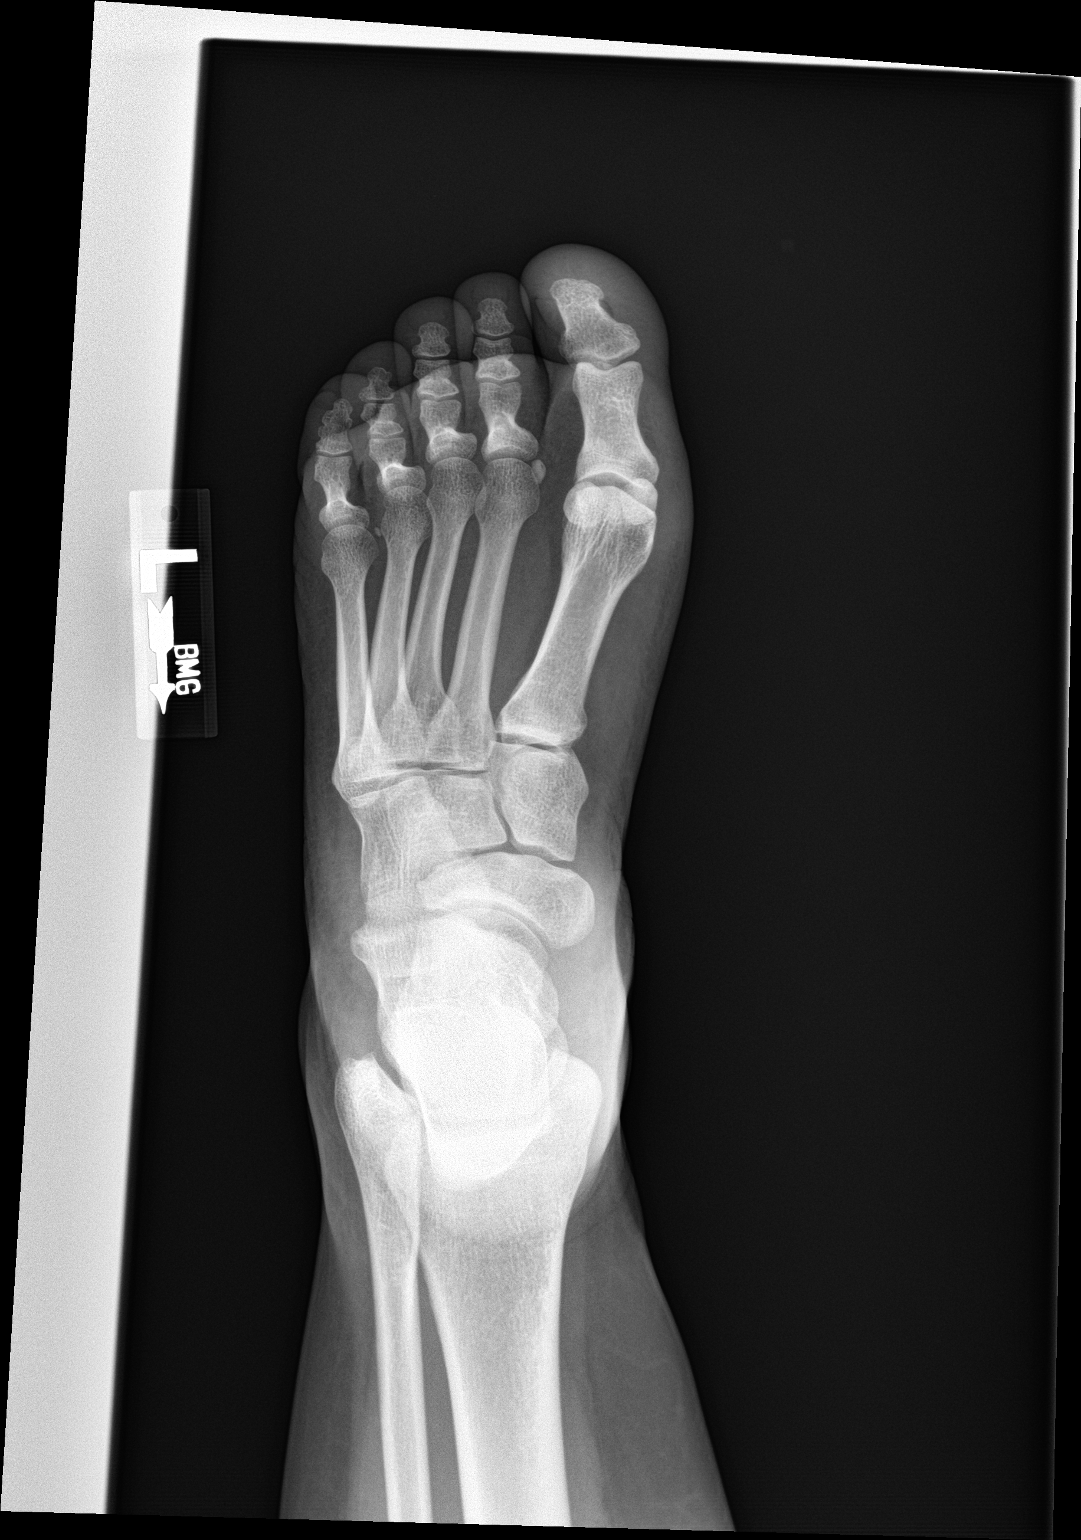

[foot obl]
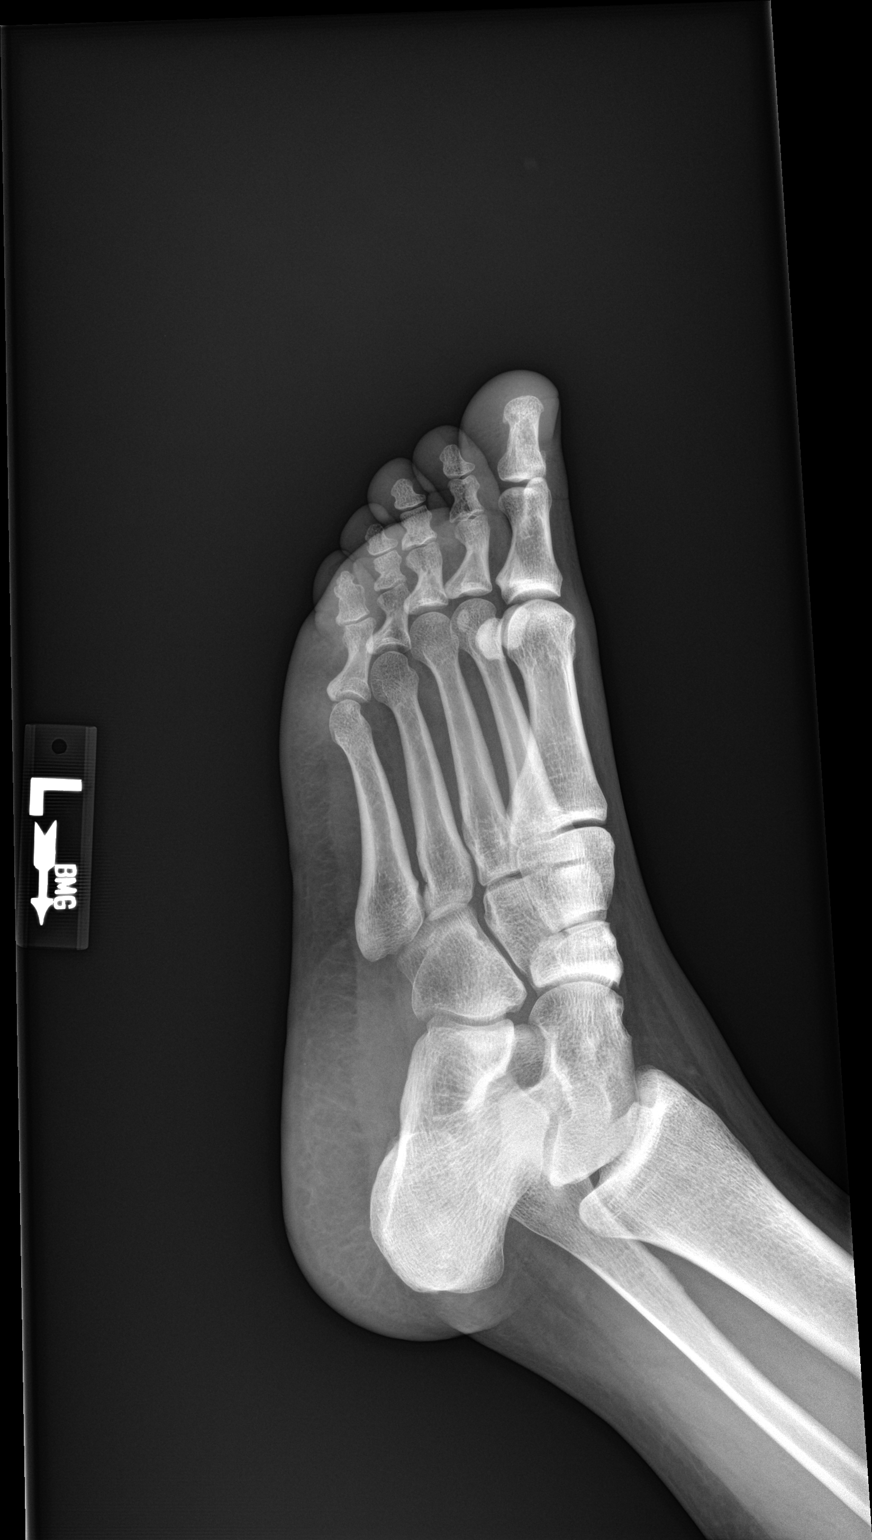

[foot lat]
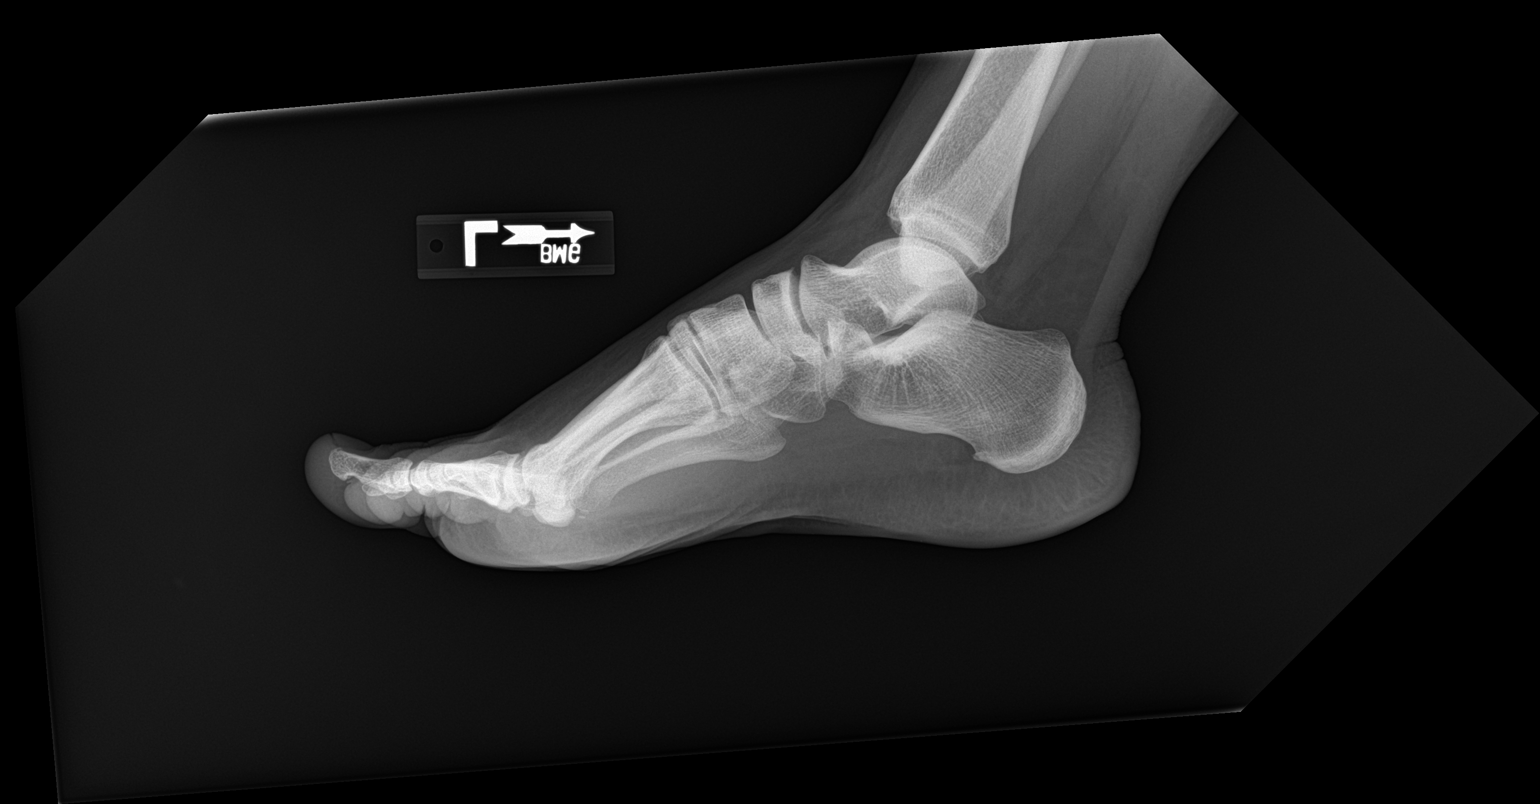

[3 of 3 positions shown; findings below may reference images not displayed]

FINDINGS: Frontal, oblique, and lateral views were obtained. There is no
fracture or dislocation. Joint spaces appear normal. No erosive
change. There is a minimal inferior calcaneal spur.
IMPRESSION: Minimal inferior calcaneal spur. No fracture or dislocation. No
appreciable arthropathy.

## 2022-07-07 ENCOUNTER — Emergency Department (HOSPITAL_COMMUNITY)
Admission: EM | Admit: 2022-07-07 | Discharge: 2022-07-07 | Disposition: A | Discharge status: Home / Self Care | Payer: Medicaid Other | Attending: Emergency Medicine | Admitting: Emergency Medicine

## 2022-07-07 DIAGNOSIS — T7840XA Allergy, unspecified, initial encounter: Secondary | ICD-10-CM

## 2022-07-07 DIAGNOSIS — Z91040 Latex allergy status: Secondary | ICD-10-CM | POA: Insufficient documentation

## 2022-07-07 MED ORDER — DEXAMETHASONE SODIUM PHOSPHATE 4 MG/ML IJ SOLN
4.0000 mg | Freq: Once | INTRAMUSCULAR | Status: AC
  Administered 2022-07-07: 4 mg via INTRAMUSCULAR
  Filled 2022-07-07: qty 1

## 2022-07-07 MED ORDER — PREDNISONE 10 MG PO TABS: 20.0000 mg | ORAL_TABLET | Freq: Every day | ORAL | 0 refills | Status: AC

## 2022-07-07 MED ORDER — FAMOTIDINE 20 MG PO TABS
20.0000 mg | ORAL_TABLET | Freq: Once | ORAL | Status: AC
  Administered 2022-07-07: 20 mg via ORAL
  Filled 2022-07-07: qty 1

## 2022-07-07 MED ORDER — LORATADINE 10 MG PO TABS
10.0000 mg | ORAL_TABLET | Freq: Once | ORAL | Status: AC
  Administered 2022-07-07: 10 mg via ORAL
  Filled 2022-07-07: qty 1

## 2022-07-07 MED ORDER — FAMOTIDINE 20 MG PO TABS: 20.0000 mg | ORAL_TABLET | Freq: Two times a day (BID) | ORAL | 0 refills | Status: AC

## 2022-07-07 NOTE — ED Provider Notes (Signed)
[place] [facility]PARTMENT AT [facility] Provider Note   CSN: [id number] Arrival date & time: 07/07/22  0059     History  Chief Complaint  Patient presents with   Allergic Reaction    Joanne Hanna is a 42 y.o. female.  The history is provided by the patient.  Allergic Reaction Presenting symptoms: itching   Presenting symptoms: no difficulty breathing, no difficulty swallowing, no rash, no swelling and no wheezing   Severity:  Moderate Duration:  1 hour Prior allergic episodes:  Unable to specify Context: not grass   Relieved by:  Nothing Worsened by:  Nothing Ineffective treatments:  None tried Itching all over x 1 hour      Home Medications Prior to Admission medications   Medication Sig Start Date End Date Taking? Authorizing Provider  famotidine (PEPCID) 20 MG tablet Take 1 tablet (20 mg total) by mouth 2 (two) times daily. 07/07/22  Yes Shonette Rhames, MD  predniSONE (DELTASONE) 10 MG tablet Take 2 tablets (20 mg total) by mouth daily. 07/07/22  Yes Kellye Mizner, MD  acetaminophen (TYLENOL) 500 MG tablet Take 1 tablet (500 mg total) by mouth every 6 (six) hours as needed. Patient not taking: Reported on 12/07/2016 02/15/14   Everlene Farrier, PA-C  loperamide (IMODIUM) 2 MG capsule Take 1 capsule (2 mg total) by mouth 4 (four) times daily as needed for diarrhea or loose stools. 12/07/16   Elpidio Anis, PA-C  ondansetron (ZOFRAN ODT) 4 MG disintegrating tablet Take 1 tablet (4 mg total) by mouth every 8 (eight) hours as needed for nausea or vomiting. 12/07/16   Elpidio Anis, PA-C      Allergies    Aspirin, Food, Motrin [ibuprofen], Latex, and Penicillins    Review of Systems   Review of Systems  Constitutional:  Negative for fever.  HENT:  Negative for trouble swallowing.   Respiratory:  Negative for wheezing.   Genitourinary:  Negative for difficulty urinating.  Skin:  Positive for itching. Negative for rash.  All other systems reviewed and are  negative.   Physical Exam Updated Vital Signs BP (!) 149/114 (BP Location: Left Arm)   Pulse 94   Temp 98 F (36.7 C) (Oral)   Resp 18   Ht 5\' 2"  (1.575 m)   Wt 83.9 kg   LMP 06/22/2022   SpO2 98%   BMI 33.84 kg/m  Physical Exam Vitals and nursing note reviewed.  Constitutional:      General: She is not in acute distress.    Appearance: Normal appearance. She is well-developed.  HENT:     Head: Normocephalic and atraumatic.     Nose: Nose normal.     Mouth/Throat:     Mouth: Mucous membranes are moist.     Pharynx: Oropharynx is clear.  Eyes:     Pupils: Pupils are equal, round, and reactive to light.  Cardiovascular:     Rate and Rhythm: Normal rate and regular rhythm.     Pulses: Normal pulses.     Heart sounds: Normal heart sounds.  Pulmonary:     Effort: Pulmonary effort is normal. No respiratory distress.     Breath sounds: Normal breath sounds.  Abdominal:     General: Bowel sounds are normal. There is no distension.     Palpations: Abdomen is soft.     Tenderness: There is no abdominal tenderness. There is no guarding or rebound.  Genitourinary:    Vagina: No vaginal discharge.  Musculoskeletal:  General: Normal range of motion.     Cervical back: Neck supple.  Skin:    General: Skin is warm and dry.     Capillary Refill: Capillary refill takes less than 2 seconds.     Findings: No erythema or rash.  Neurological:     General: No focal deficit present.     Mental Status: She is alert and oriented to person, place, and time.     Deep Tendon Reflexes: Reflexes normal.  Psychiatric:        Mood and Affect: Mood normal.        Behavior: Behavior normal.    ED Results / Procedures / Treatments   Labs (all labs ordered are listed, but only abnormal results are displayed) Labs Reviewed - No data to display  EKG None  Radiology No results found.  Procedures Procedures    Medications Ordered in ED Medications  loratadine (CLARITIN) tablet  10 mg (10 mg Oral Given 07/07/22 0132)  famotidine (PEPCID) tablet 20 mg (20 mg Oral Given 07/07/22 0132)  dexamethasone (DECADRON) injection 4 mg (4 mg Intramuscular Given 07/07/22 0132)    ED Course/ Medical Decision Making/ A&P                             Medical Decision Making Itching all over x 1 hour   Amount and/or Complexity of Data Reviewed External Data Reviewed: notes.    Details: Previous notes reviewed   Risk OTC drugs. Prescription drug management. Risk Details: Medications ordered in the RX for pepcid and prednisone.  Continue zyrtec or claritin daily.  Stable for discharge.  Strict return precautions.      Final Clinical Impression(s) / ED Diagnoses Final diagnoses:  Allergic reaction, initial encounter   Return for intractable cough, coughing up blood, fevers > 100.4 unrelieved by medication, shortness of breath, intractable vomiting, chest pain, shortness of breath, weakness, numbness, changes in speech, facial asymmetry, abdominal pain, passing out, Inability to tolerate liquids or food, cough, altered mental status or any concerns. No signs of systemic illness or infection. The patient is nontoxic-appearing on exam and vital signs are within normal limits.  I have reviewed the triage vital signs and the nursing notes. Pertinent labs & imaging results that were available during my care of the patient were reviewed by me and considered in my medical decision making (see chart for details). After history, exam, and medical workup I feel the patient has been appropriately medically screened and is safe for discharge home. Pertinent diagnoses were discussed with the patient. Patient was given return precautions.  Rx / DC Orders ED Discharge Orders          Ordered    predniSONE (DELTASONE) 10 MG tablet  Daily        07/07/22 0216    famotidine (PEPCID) 20 MG tablet  2 times daily        07/07/22 0216              Tarik Teixeira, MD 07/07/22 [id number]

## 2022-07-07 NOTE — ED Triage Notes (Signed)
Patient arrived stating about 30 minutes prior to arrival she thinks she came in contact with an allergen at work, reports she is itching and burning all over. Airway intact.
